# Patient Record
Sex: Male | Born: 1955 | Race: White | Hispanic: No | Marital: Married | Smoking: Never smoker
Health system: Southern US, Community
[De-identification: ages and names within clinical notes are randomized; demographics above are authoritative.]

## PROBLEM LIST (undated history)

## (undated) DIAGNOSIS — E669 Obesity, unspecified: Secondary | ICD-10-CM

## (undated) DIAGNOSIS — I4819 Other persistent atrial fibrillation: Secondary | ICD-10-CM

## (undated) DIAGNOSIS — K219 Gastro-esophageal reflux disease without esophagitis: Secondary | ICD-10-CM

## (undated) DIAGNOSIS — I10 Essential (primary) hypertension: Secondary | ICD-10-CM

## (undated) DIAGNOSIS — E7800 Pure hypercholesterolemia, unspecified: Secondary | ICD-10-CM

## (undated) DIAGNOSIS — I471 Supraventricular tachycardia, unspecified: Secondary | ICD-10-CM

## (undated) DIAGNOSIS — C4492 Squamous cell carcinoma of skin, unspecified: Secondary | ICD-10-CM

## (undated) DIAGNOSIS — F419 Anxiety disorder, unspecified: Secondary | ICD-10-CM

## (undated) DIAGNOSIS — J45909 Unspecified asthma, uncomplicated: Secondary | ICD-10-CM

## (undated) DIAGNOSIS — Z860100 Personal history of colon polyps, unspecified: Secondary | ICD-10-CM

## (undated) DIAGNOSIS — N529 Male erectile dysfunction, unspecified: Secondary | ICD-10-CM

## (undated) HISTORY — DX: Squamous cell carcinoma of skin, unspecified: C44.92

## (undated) HISTORY — DX: Male erectile dysfunction, unspecified: N52.9

## (undated) HISTORY — DX: Supraventricular tachycardia: I47.1

## (undated) HISTORY — DX: Gastro-esophageal reflux disease without esophagitis: K21.9

## (undated) HISTORY — DX: Personal history of colonic polyps: Z86.010

## (undated) HISTORY — DX: Unspecified asthma, uncomplicated: J45.909

## (undated) HISTORY — PX: WISDOM TOOTH EXTRACTION: SHX21

## (undated) HISTORY — PX: OTHER SURGICAL HISTORY: SHX169

## (undated) HISTORY — DX: Anxiety disorder, unspecified: F41.9

## (undated) HISTORY — DX: Obesity, unspecified: E66.9

## (undated) HISTORY — DX: Pure hypercholesterolemia, unspecified: E78.00

## (undated) HISTORY — DX: Essential (primary) hypertension: I10

## (undated) HISTORY — DX: Personal history of colon polyps, unspecified: Z86.0100

## (undated) HISTORY — DX: Supraventricular tachycardia, unspecified: I47.10

---

## 2006-07-13 ENCOUNTER — Emergency Department (HOSPITAL_COMMUNITY): Admission: EM | Admit: 2006-07-13 | Discharge: 2006-07-13 | Payer: Self-pay | Admitting: Emergency Medicine

## 2007-11-05 ENCOUNTER — Ambulatory Visit: Admission: RE | Admit: 2007-11-05 | Discharge: 2008-02-03 | Payer: Self-pay | Admitting: Radiation Oncology

## 2011-09-15 ENCOUNTER — Encounter: Payer: Self-pay | Admitting: Critical Care Medicine

## 2011-10-16 ENCOUNTER — Encounter: Payer: Self-pay | Admitting: Critical Care Medicine

## 2011-10-17 ENCOUNTER — Ambulatory Visit (HOSPITAL_COMMUNITY)
Admission: RE | Admit: 2011-10-17 | Discharge: 2011-10-17 | Disposition: A | Discharge status: Home / Self Care | Payer: BC Managed Care – PPO | Source: Ambulatory Visit | Attending: Critical Care Medicine | Admitting: Critical Care Medicine

## 2011-10-17 ENCOUNTER — Encounter: Payer: Self-pay | Admitting: Critical Care Medicine

## 2011-10-17 ENCOUNTER — Ambulatory Visit (INDEPENDENT_AMBULATORY_CARE_PROVIDER_SITE_OTHER): Payer: BC Managed Care – PPO | Admitting: Critical Care Medicine

## 2011-10-17 VITALS — BP 122/72 | HR 52 | Temp 98.3°F | Ht 73.0 in | Wt 280.4 lb

## 2011-10-17 DIAGNOSIS — J45909 Unspecified asthma, uncomplicated: Secondary | ICD-10-CM | POA: Insufficient documentation

## 2011-10-17 DIAGNOSIS — R0600 Dyspnea, unspecified: Secondary | ICD-10-CM

## 2011-10-17 DIAGNOSIS — F419 Anxiety disorder, unspecified: Secondary | ICD-10-CM | POA: Insufficient documentation

## 2011-10-17 DIAGNOSIS — E669 Obesity, unspecified: Secondary | ICD-10-CM | POA: Insufficient documentation

## 2011-10-17 DIAGNOSIS — R0609 Other forms of dyspnea: Secondary | ICD-10-CM

## 2011-10-17 DIAGNOSIS — R05 Cough: Secondary | ICD-10-CM | POA: Insufficient documentation

## 2011-10-17 DIAGNOSIS — N529 Male erectile dysfunction, unspecified: Secondary | ICD-10-CM | POA: Insufficient documentation

## 2011-10-17 DIAGNOSIS — J988 Other specified respiratory disorders: Secondary | ICD-10-CM | POA: Insufficient documentation

## 2011-10-17 DIAGNOSIS — R0989 Other specified symptoms and signs involving the circulatory and respiratory systems: Secondary | ICD-10-CM

## 2011-10-17 DIAGNOSIS — F411 Generalized anxiety disorder: Secondary | ICD-10-CM

## 2011-10-17 DIAGNOSIS — R059 Cough, unspecified: Secondary | ICD-10-CM

## 2011-10-17 DIAGNOSIS — I4891 Unspecified atrial fibrillation: Secondary | ICD-10-CM

## 2011-10-17 DIAGNOSIS — I480 Paroxysmal atrial fibrillation: Secondary | ICD-10-CM | POA: Insufficient documentation

## 2011-10-17 DIAGNOSIS — I10 Essential (primary) hypertension: Secondary | ICD-10-CM

## 2011-10-17 LAB — PULMONARY FUNCTION TEST

## 2011-10-17 MED ORDER — ALBUTEROL SULFATE (5 MG/ML) 0.5% IN NEBU
2.5000 mg | INHALATION_SOLUTION | Freq: Once | RESPIRATORY_TRACT | Status: AC
  Administered 2011-10-17: 2.5 mg via RESPIRATORY_TRACT

## 2011-10-17 NOTE — Assessment & Plan Note (Signed)
Chronic exertional dyspnea with hyperinflation on chest x-ray in a patient with MZ alpha one  Phenotype We need to determine the patient's current alpha 1 antitrypsin levels, if less than 10 the patient might be considered a candidate for replacement therapy particularly if obstructive lung disease is documented The patient does have a family history of alpha 1 antitrypsin deficiency and his daughter who is a Z Z. Phenotype  Plan Check alpha-1 level Obtain full set of pulmonary function studies Obtain an overnight sleep oximetry Therapy will be dependent upon results of the above studies

## 2011-10-17 NOTE — Progress Notes (Signed)
Subjective:    Patient ID: Calvin Collins, male    DOB: 01-Aug-1956, 56 y.o.   MRN: [id number]  HPI 56 y.o. M referred for abn CXR Pt with decline of lung function since 56yo.  PiMZ phenotype.  Father died of emphysema and daughter is ZZ dx 3yo.  TPL 7yo and 22 and in good health.  Both wife MZ.  One MM and One MZ and ZZ. Father had lung dz, two brothers with heart disease.  Last had phenotyping in 1993.  Never smoker. Occ cigars but none now. Father smoked 74yrs and quit.  No other passive smoke.  Now is dypsneic with activity.  Does run and works out but declining lung capacity.   CXR shows hyperinflation.  Does have a deep wheezing cough.  Coughs every day. Does not have a productive cough.  Spouse is on symbicort and steroids and hx asthma as a child.  Past Medical History  Diagnosis Date  . Paroxysmal atrial fibrillation     OCCASIONAL SVT  . Hypertension   . Obesity   . Fatigue   . SOB (shortness of breath)   . ED (erectile dysfunction)   . Anxiety      Family History  Problem Relation Age of Onset  . Emphysema Father   . Emphysema Paternal Uncle   . Other Daughter     liver transplant  . Hypertension Mother   . Hypertension Father   . Stroke Mother   . Cancer Brother   . Skin cancer Brother     basal cell  . Skin cancer Sister     basal cell     History   Social History  . Marital Status: Married    Spouse Name: N/A    Number of Children: 4  . Years of Education: N/A   Occupational History  . College Professor    Social History Main Topics  . Smoking status: Never Smoker   . Smokeless tobacco: Never Used  . Alcohol Use: Yes     4-5 times weekly  . Drug Use: No  . Sexually Active: Not on file   Other Topics Concern  . Not on file   Social History Narrative  . No narrative on file     No Known Allergies   Outpatient Prescriptions Prior to Visit  Medication Sig Dispense Refill  . nebivolol (BYSTOLIC) 10 MG tablet Take 10 mg by mouth daily.          No facility-administered medications prior to visit.      Review of Systems  Constitutional: Positive for chills, fatigue and unexpected weight change. Negative for fever, diaphoresis, activity change and appetite change.  HENT: Positive for congestion, trouble swallowing, neck pain and neck stiffness. Negative for hearing loss, ear pain, nosebleeds, sore throat, facial swelling, rhinorrhea, sneezing, mouth sores, dental problem, voice change, postnasal drip, sinus pressure, tinnitus and ear discharge.   Eyes: Negative for photophobia, discharge, itching and visual disturbance.  Respiratory: Positive for cough, chest tightness, shortness of breath and wheezing. Negative for apnea, choking and stridor.   Cardiovascular: Positive for palpitations and leg swelling. Negative for chest pain.  Gastrointestinal: Negative for nausea, vomiting, abdominal pain, constipation, blood in stool and abdominal distention.  Genitourinary: Positive for frequency. Negative for dysuria, urgency, hematuria, flank pain, decreased urine volume and difficulty urinating.  Musculoskeletal: Positive for arthralgias. Negative for myalgias, back pain, joint swelling and gait problem.  Skin: Negative for color change, pallor and rash.  Neurological: Positive for  tremors and weakness. Negative for dizziness, seizures, syncope, speech difficulty, light-headedness, numbness and headaches.  Hematological: Negative for adenopathy. Does not bruise/bleed easily.  Psychiatric/Behavioral: Positive for sleep disturbance. Negative for confusion and agitation. The patient is not nervous/anxious.        Objective:   Physical Exam  Filed Vitals:   10/17/11 0900  BP: 122/72  Pulse: 52  Temp: 98.3 F (36.8 C)  TempSrc: Oral  Height: 6\' 1"  (1.854 m)  Weight: 280 lb 6.4 oz (127.189 kg)  SpO2: 99%    Gen: Pleasant, well-nourished, in no distress,  normal affect  ENT: No lesions,  mouth clear,  oropharynx clear, no  postnasal drip  Neck: No JVD, no TMG, no carotid bruits  Lungs: No use of accessory muscles, no dullness to percussion, distant BS  Cardiovascular: RRR, heart sounds normal, no murmur or gallops, no peripheral edema  Abdomen: soft and NT, no HSM,  BS normal  Musculoskeletal: No deformities, no cyanosis or clubbing  Neuro: alert, non focal  Skin: Warm, no lesions or rashes  CXR:  08/28/11:  Hyperinflation, no active disease        Assessment & Plan:   Dyspnea Chronic exertional dyspnea with hyperinflation on chest x-ray in a patient with MZ alpha one  Phenotype We need to determine the patient's current alpha 1 antitrypsin levels, if less than 10 the patient might be considered a candidate for replacement therapy particularly if obstructive lung disease is documented The patient does have a family history of alpha 1 antitrypsin deficiency and his daughter who is a Z Z. Phenotype  Plan Check alpha-1 level Obtain full set of pulmonary function studies Obtain an overnight sleep oximetry Therapy will be dependent upon results of the above studies    Updated Medication List Outpatient Encounter Prescriptions as of 10/17/2011  Medication Sig Dispense Refill  . guaiFENesin (MUCINEX) 600 MG 12 hr tablet Take 600 mg by mouth as needed.      . nebivolol (BYSTOLIC) 10 MG tablet Take 10 mg by mouth daily.        . sertraline (ZOLOFT) 25 MG tablet Take 25 mg by mouth daily.       No facility-administered encounter medications on file as of 10/17/2011.

## 2011-10-17 NOTE — Patient Instructions (Signed)
An alpha one antitrypsin assay will be obtained>>this takes two weeks to return A full pulmonary function test will be obtained, based upon this , will likely prescribed some type of inhaler to use  An overnight sleep oxygen test will be obtained Focus on weight loss. See weight loss instruction sheet. Return 2 months, I will call sooner with test results

## 2011-10-29 ENCOUNTER — Telehealth: Payer: Self-pay | Admitting: Critical Care Medicine

## 2011-10-29 DIAGNOSIS — R0600 Dyspnea, unspecified: Secondary | ICD-10-CM

## 2011-10-29 NOTE — Telephone Encounter (Signed)
Pt PFTs reviewed. Mod peripheral airflow obstruction seen Pt called, left msg, he will need OV soon to discuss results.  Need to get alpha one test and ONO on RA back before OV made

## 2011-10-29 NOTE — Telephone Encounter (Signed)
Pt returned call.  He did receive PW's message regarding PFT results and was wondering he if still needs to have the ONO done which is scheduled for tonight.  I advised pt he will need OV soon to discuss results, but before this appt is scheduled, PW would like to get the results back from the alpha one and ONO.  He verbalized understanding of this and will proceed with ONO tonight.  I advised pt I will call him back once we receive the alpha one test results to schedule OV with PW but if anything further is needed prior to this to please call back.  He verbalized understanding of this.  Will hold in my box to f/u on.  Note: Alpha one test done on 10/17/11

## 2011-10-30 ENCOUNTER — Encounter: Payer: Self-pay | Admitting: Critical Care Medicine

## 2011-11-03 NOTE — Telephone Encounter (Signed)
Called [phone], spoke with Westly Pam to check on the status of alpha 1 test who states results are not quite ready yet but should be mailed out in the next day or two. Will hold in my box to follow up on.

## 2011-11-04 ENCOUNTER — Encounter: Payer: Self-pay | Admitting: Critical Care Medicine

## 2011-11-04 NOTE — Telephone Encounter (Signed)
I called pt's home and cell #'s - [id number]

## 2011-11-04 NOTE — Telephone Encounter (Signed)
Call pt and tell him ONO was normal.  No need for any oxygen. Still waiting on Alpha one test.

## 2011-11-06 NOTE — Telephone Encounter (Signed)
Received Alpha 1 Test results - MZ.  Per Dr. Delford Field, tell pt he is a carrier but does not need replacement therapy at this time.  Still have him come in for OV to discuss everything in detail.    Called pt's home and cell #'s - [email or web address] to inform him of ONO results, alpha 1 results, and schedule f/u with PW.

## 2011-11-07 ENCOUNTER — Encounter: Payer: Self-pay | Admitting: Critical Care Medicine

## 2011-11-10 ENCOUNTER — Telehealth: Payer: Self-pay | Admitting: Critical Care Medicine

## 2011-11-10 NOTE — Telephone Encounter (Signed)
Spoke with pt.  I informed him of Alpha 1 and ONO results and recs per Dr. Delford Field and advised Dr. Delford Field would still like him to come in for OV to discuss everything in detail.  He verbalized understanding of this and has a pending OV with Dr. Delford Field on December 22, 2011 at 9 am in [place].  Pt is requesting to keep this appt for now - He did not was to schedule an OV sooner at this time.  Advised if anything is needed prior to this or he decides he would like to come in sooner, to please call back.  He verbalized understanding of this, it ok with this plan,  and voiced no further questions/cocnerns at this time.

## 2011-11-10 NOTE — Telephone Encounter (Signed)
Called pt's home and cell #s - [id number] 

## 2011-11-10 NOTE — Telephone Encounter (Signed)
Spoke with pt.  Pls see phone note from 10/29/2011 for additional information.

## 2011-11-20 ENCOUNTER — Encounter: Payer: Self-pay | Admitting: Critical Care Medicine

## 2011-12-22 ENCOUNTER — Ambulatory Visit (INDEPENDENT_AMBULATORY_CARE_PROVIDER_SITE_OTHER): Payer: BC Managed Care – PPO | Admitting: Critical Care Medicine

## 2011-12-22 ENCOUNTER — Encounter: Payer: Self-pay | Admitting: Critical Care Medicine

## 2011-12-22 VITALS — BP 140/86 | HR 55 | Temp 98.2°F | Ht 72.0 in | Wt 275.5 lb

## 2011-12-22 DIAGNOSIS — J45909 Unspecified asthma, uncomplicated: Secondary | ICD-10-CM

## 2011-12-22 MED ORDER — MOMETASONE FUROATE 220 MCG/INH IN AEPB: 2.0000 | INHALATION_SPRAY | Freq: Every day | RESPIRATORY_TRACT | Status: DC

## 2011-12-22 NOTE — Progress Notes (Signed)
Subjective:    Patient ID: Calvin Collins, male    DOB: 1955-10-06, 56 y.o.   MRN: [id number]  HPI  57 y.o. M referred for abn CXR Pt with decline of lung function since 56yo.  PiMZ phenotype.  Father died of emphysema and daughter is ZZ dx 3yo.  TPL 9yo and 22 and in good health.  Both wife MZ.  One MM and One MZ and ZZ. Father had lung dz, two brothers with heart disease.  Last had phenotyping in 1993.  Never smoker. Occ cigars but none now. Father smoked 39yrs and quit.  No other passive smoke.  Now is dypsneic with activity.  Does run and works out but declining lung capacity.   CXR shows hyperinflation.  Does have a deep wheezing cough.  Coughs every day. Does not have a productive cough.  Spouse is on symbicort and steroids and hx asthma as a child.  12/22/2011 MZ on phenotype level 18. Symptom now is a cough,  Cough more common.  CXR hyperinflation.  A1AT: 18  MZ phenotype. Able to run and do well.  Cough is deep and wheezing like. Cough more in evening than AM. Pt denies any significant sore throat, nasal congestion or excess secretions, fever, chills, sweats, unintended weight loss, pleurtic or exertional chest pain, orthopnea PND, or leg swelling Pt denies any increase in rescue therapy over baseline, denies waking up needing it or having any early am or nocturnal exacerbations of coughing/wheezing/or dyspnea. Pt also denies any obvious fluctuation in symptoms with  weather or environmental change or other alleviating or aggravating factors    Past Medical History  Diagnosis Date  . Paroxysmal atrial fibrillation     OCCASIONAL SVT  . Hypertension   . Obesity   . Fatigue   . SOB (shortness of breath)   . ED (erectile dysfunction)   . Anxiety      Family History  Problem Relation Age of Onset  . Emphysema Father   . Emphysema Paternal Uncle   . Other Daughter     liver transplant  . Hypertension Mother   . Hypertension Father   . Stroke Mother   . Cancer Brother     . Skin cancer Brother     basal cell  . Skin cancer Sister     basal cell     History   Social History  . Marital Status: Married    Spouse Name: N/A    Number of Children: 4  . Years of Education: N/A   Occupational History  . College Professor    Social History Main Topics  . Smoking status: Never Smoker   . Smokeless tobacco: Never Used  . Alcohol Use: Yes     4-5 times weekly  . Drug Use: No  . Sexually Active: Not on file   Other Topics Concern  . Not on file   Social History Narrative  . No narrative on file     No Known Allergies   Outpatient Prescriptions Prior to Visit  Medication Sig Dispense Refill  . guaiFENesin (MUCINEX) 600 MG 12 hr tablet Take 600 mg by mouth as needed.      . nebivolol (BYSTOLIC) 10 MG tablet Take 10 mg by mouth daily.        . sertraline (ZOLOFT) 25 MG tablet Take 25 mg by mouth daily.          Review of Systems  Constitutional: Positive for unexpected weight change. Negative for fever, chills, diaphoresis,  activity change, appetite change and fatigue.  HENT: Positive for congestion, trouble swallowing, neck pain and neck stiffness. Negative for hearing loss, ear pain, nosebleeds, sore throat, facial swelling, rhinorrhea, sneezing, mouth sores, dental problem, voice change, postnasal drip, sinus pressure, tinnitus and ear discharge.   Eyes: Negative for photophobia, discharge, itching and visual disturbance.  Respiratory: Positive for cough, chest tightness, shortness of breath and wheezing. Negative for apnea, choking and stridor.   Cardiovascular: Negative for chest pain, palpitations and leg swelling.  Gastrointestinal: Negative for nausea, vomiting, abdominal pain, constipation, blood in stool and abdominal distention.  Genitourinary: Positive for frequency. Negative for dysuria, urgency, hematuria, flank pain, decreased urine volume and difficulty urinating.  Musculoskeletal: Positive for arthralgias. Negative for myalgias,  back pain, joint swelling and gait problem.  Skin: Negative for color change, pallor and rash.  Neurological: Negative for dizziness, tremors, seizures, syncope, speech difficulty, weakness, light-headedness, numbness and headaches.  Hematological: Negative for adenopathy. Does not bruise/bleed easily.  Psychiatric/Behavioral: Positive for sleep disturbance. Negative for confusion and agitation. The patient is not nervous/anxious.        Objective:   Physical Exam   Filed Vitals:   12/22/11 0902  BP: 140/86  Pulse: 55  Temp: 98.2 F (36.8 C)  TempSrc: Oral  Height: 6' (1.829 m)  Weight: 275 lb 8 oz (124.966 kg)  SpO2: 98%    Gen: Pleasant, well-nourished, in no distress,  normal affect  ENT: No lesions,  mouth clear,  oropharynx clear, no postnasal drip  Neck: No JVD, no TMG, no carotid bruits  Lungs: No use of accessory muscles, no dullness to percussion, distant BS  Cardiovascular: RRR, heart sounds normal, no murmur or gallops, no peripheral edema  Abdomen: soft and NT, no HSM,  BS normal  Musculoskeletal: No deformities, no cyanosis or clubbing  Neuro: alert, non focal  Skin: Warm, no lesions or rashes  CXR:  08/28/11:  Hyperinflation, no active disease        Assessment & Plan:   Asthma with allergic rhinitis Asthma without Copd or emphysema A1AT levels normal despite MZ phenotype No desat at night Plan Asmanex two puff daily     Updated Medication List Outpatient Encounter Prescriptions as of 12/22/2011  Medication Sig Dispense Refill  . guaiFENesin (MUCINEX) 600 MG 12 hr tablet Take 600 mg by mouth as needed.      Marland Kitchen LORazepam (ATIVAN) 1 MG tablet as needed. Seldom takes this      . nebivolol (BYSTOLIC) 10 MG tablet Take 10 mg by mouth daily.        . sertraline (ZOLOFT) 25 MG tablet Take 25 mg by mouth daily.      . mometasone (ASMANEX 120 METERED DOSES) 220 MCG/INH inhaler Inhale 2 puffs into the lungs daily.  1 Inhaler  12

## 2011-12-22 NOTE — Patient Instructions (Signed)
Start asmanex two puff daily Return 4 months

## 2011-12-22 NOTE — Assessment & Plan Note (Signed)
Asthma without Copd or emphysema A1AT levels normal despite MZ phenotype No desat at night Plan Asmanex two puff daily

## 2013-08-24 ENCOUNTER — Other Ambulatory Visit: Payer: Self-pay | Admitting: Family Medicine

## 2013-08-24 DIAGNOSIS — R1011 Right upper quadrant pain: Secondary | ICD-10-CM

## 2013-08-29 ENCOUNTER — Ambulatory Visit
Admission: RE | Admit: 2013-08-29 | Discharge: 2013-08-29 | Disposition: A | Discharge status: Home / Self Care | Payer: BC Managed Care – PPO | Source: Ambulatory Visit | Attending: Family Medicine | Admitting: Family Medicine

## 2013-08-29 ENCOUNTER — Other Ambulatory Visit: Payer: Self-pay | Admitting: Family Medicine

## 2013-08-29 DIAGNOSIS — R1011 Right upper quadrant pain: Secondary | ICD-10-CM

## 2013-08-30 ENCOUNTER — Other Ambulatory Visit: Payer: Self-pay | Admitting: Family Medicine

## 2013-08-30 DIAGNOSIS — R109 Unspecified abdominal pain: Secondary | ICD-10-CM

## 2013-09-06 ENCOUNTER — Ambulatory Visit
Admission: RE | Admit: 2013-09-06 | Discharge: 2013-09-06 | Disposition: A | Discharge status: Home / Self Care | Payer: BC Managed Care – PPO | Source: Ambulatory Visit | Attending: Family Medicine | Admitting: Family Medicine

## 2013-09-06 DIAGNOSIS — R109 Unspecified abdominal pain: Secondary | ICD-10-CM

## 2013-09-06 MED ORDER — IOHEXOL 300 MG/ML  SOLN
125.0000 mL | Freq: Once | INTRAMUSCULAR | Status: AC | PRN
  Administered 2013-09-06: 125 mL via INTRAVENOUS

## 2014-10-25 ENCOUNTER — Other Ambulatory Visit: Payer: Self-pay | Admitting: Family Medicine

## 2014-10-25 ENCOUNTER — Encounter (INDEPENDENT_AMBULATORY_CARE_PROVIDER_SITE_OTHER): Payer: Self-pay

## 2014-10-25 ENCOUNTER — Ambulatory Visit
Admission: RE | Admit: 2014-10-25 | Discharge: 2014-10-25 | Disposition: A | Discharge status: Home / Self Care | Payer: BC Managed Care – PPO | Source: Ambulatory Visit | Attending: Family Medicine | Admitting: Family Medicine

## 2014-10-25 DIAGNOSIS — J45909 Unspecified asthma, uncomplicated: Secondary | ICD-10-CM

## 2017-02-20 ENCOUNTER — Other Ambulatory Visit: Payer: Self-pay | Admitting: Family Medicine

## 2017-02-20 DIAGNOSIS — R1032 Left lower quadrant pain: Secondary | ICD-10-CM

## 2017-02-24 ENCOUNTER — Ambulatory Visit
Admission: RE | Admit: 2017-02-24 | Discharge: 2017-02-24 | Disposition: A | Discharge status: Home / Self Care | Payer: BC Managed Care – PPO | Source: Ambulatory Visit | Attending: Family Medicine | Admitting: Family Medicine

## 2017-02-24 DIAGNOSIS — R1032 Left lower quadrant pain: Secondary | ICD-10-CM

## 2017-02-24 MED ORDER — IOPAMIDOL (ISOVUE-300) INJECTION 61%
125.0000 mL | Freq: Once | INTRAVENOUS | Status: AC | PRN
  Administered 2017-02-24: 125 mL via INTRAVENOUS

## 2017-03-02 ENCOUNTER — Other Ambulatory Visit: Payer: Self-pay | Admitting: Family Medicine

## 2017-03-02 DIAGNOSIS — M5432 Sciatica, left side: Secondary | ICD-10-CM

## 2017-03-05 ENCOUNTER — Other Ambulatory Visit: Payer: BC Managed Care – PPO

## 2017-03-05 ENCOUNTER — Ambulatory Visit
Admission: RE | Admit: 2017-03-05 | Discharge: 2017-03-05 | Disposition: A | Discharge status: Home / Self Care | Payer: BC Managed Care – PPO | Source: Ambulatory Visit | Attending: Family Medicine | Admitting: Family Medicine

## 2017-03-05 DIAGNOSIS — M5432 Sciatica, left side: Secondary | ICD-10-CM

## 2018-01-13 ENCOUNTER — Other Ambulatory Visit: Payer: Self-pay | Admitting: Family Medicine

## 2018-01-13 ENCOUNTER — Ambulatory Visit
Admission: RE | Admit: 2018-01-13 | Discharge: 2018-01-13 | Disposition: A | Discharge status: Home / Self Care | Payer: BC Managed Care – PPO | Source: Ambulatory Visit | Attending: Family Medicine | Admitting: Family Medicine

## 2018-01-13 DIAGNOSIS — R52 Pain, unspecified: Secondary | ICD-10-CM

## 2018-01-22 ENCOUNTER — Other Ambulatory Visit (HOSPITAL_COMMUNITY): Payer: Self-pay | Admitting: Physician Assistant

## 2018-01-22 DIAGNOSIS — R634 Abnormal weight loss: Secondary | ICD-10-CM

## 2018-01-22 DIAGNOSIS — R1011 Right upper quadrant pain: Secondary | ICD-10-CM

## 2018-01-25 ENCOUNTER — Encounter (INDEPENDENT_AMBULATORY_CARE_PROVIDER_SITE_OTHER): Payer: Self-pay

## 2018-01-25 ENCOUNTER — Ambulatory Visit (HOSPITAL_COMMUNITY)
Admission: RE | Admit: 2018-01-25 | Discharge: 2018-01-25 | Disposition: A | Discharge status: Home / Self Care | Payer: BC Managed Care – PPO | Source: Ambulatory Visit | Attending: Physician Assistant | Admitting: Physician Assistant

## 2018-01-25 DIAGNOSIS — R634 Abnormal weight loss: Secondary | ICD-10-CM

## 2018-01-25 DIAGNOSIS — R1011 Right upper quadrant pain: Secondary | ICD-10-CM | POA: Insufficient documentation

## 2018-02-02 DIAGNOSIS — I4819 Other persistent atrial fibrillation: Secondary | ICD-10-CM

## 2018-02-02 HISTORY — DX: Other persistent atrial fibrillation: I48.19

## 2018-02-04 ENCOUNTER — Other Ambulatory Visit: Payer: Self-pay | Admitting: Physician Assistant

## 2018-02-04 DIAGNOSIS — R1011 Right upper quadrant pain: Secondary | ICD-10-CM

## 2018-02-04 DIAGNOSIS — R634 Abnormal weight loss: Secondary | ICD-10-CM

## 2018-02-11 ENCOUNTER — Ambulatory Visit (HOSPITAL_COMMUNITY)
Admission: RE | Admit: 2018-02-11 | Discharge: 2018-02-11 | Disposition: A | Discharge status: Home / Self Care | Payer: BC Managed Care – PPO | Source: Ambulatory Visit | Attending: Nurse Practitioner | Admitting: Nurse Practitioner

## 2018-02-11 ENCOUNTER — Encounter (HOSPITAL_COMMUNITY): Payer: Self-pay | Admitting: Nurse Practitioner

## 2018-02-11 VITALS — BP 148/84 | HR 94 | Ht 72.0 in | Wt 251.0 lb

## 2018-02-11 DIAGNOSIS — I4891 Unspecified atrial fibrillation: Secondary | ICD-10-CM | POA: Diagnosis present

## 2018-02-11 DIAGNOSIS — E669 Obesity, unspecified: Secondary | ICD-10-CM | POA: Diagnosis not present

## 2018-02-11 DIAGNOSIS — I10 Essential (primary) hypertension: Secondary | ICD-10-CM | POA: Diagnosis not present

## 2018-02-11 DIAGNOSIS — Z7901 Long term (current) use of anticoagulants: Secondary | ICD-10-CM | POA: Diagnosis not present

## 2018-02-11 DIAGNOSIS — F419 Anxiety disorder, unspecified: Secondary | ICD-10-CM | POA: Insufficient documentation

## 2018-02-11 DIAGNOSIS — Z79899 Other long term (current) drug therapy: Secondary | ICD-10-CM | POA: Diagnosis not present

## 2018-02-11 DIAGNOSIS — Z8249 Family history of ischemic heart disease and other diseases of the circulatory system: Secondary | ICD-10-CM | POA: Insufficient documentation

## 2018-02-11 DIAGNOSIS — Z823 Family history of stroke: Secondary | ICD-10-CM | POA: Diagnosis not present

## 2018-02-11 NOTE — Progress Notes (Signed)
Primary Care Physician: Harlan Stains, MD Referring Physician: Self referred   Calvin Collins is a 62 y.o. male with a h/o a h/o SVT, anxiety, HTN, that is in the afib clinic for recently diagnosed afib. He and his wife were getting ready to leave on vacation when he noted increased irregular heart beat. He was able to get into [facility] and EKG confirmed afib. He was then able be seen by Roque Cash, [place], [facility]. He was started on xarelto 20 mg daily. Echo obtained and showed Mod LVH with mod dilated left atrium. He is in afib clinic for further f/u.   He admits that he has h/o anxiety/Panic attacks around 20 years ago and came off meds 10 years ago. He has had ongoing abdominal pain and is pending imaging of his Liver next week. The unknown etiology of his pain  has caused him a lot of anxiety recetnly.  He drinks minimal caffeine, no tobacco, no sleep apnea.  One to two alcoholic drinks per evening.Works as a Professor at [employer]. He is physically active and has a HR in the upper 40's and low 50's in [place].  Bystolic dose  was decreased couple months ago for brady from 5 mg qd to 2.5 mg qd. He seems to be tolerating afib well currently.   Today, he denies symptoms of chest pain, shortness of breath, orthopnea, PND, lower extremity edema, dizziness, presyncope, syncope, or neurologic sequela. The patient is tolerating medications without difficulties and is otherwise without complaint today.   Past Medical History:  Diagnosis Date  . Anxiety   . ED (erectile dysfunction)   . Fatigue   . Hypertension   . Obesity   . Paroxysmal atrial fibrillation (HCC)    OCCASIONAL SVT  . SOB (shortness of breath)      Current Outpatient Medications  Medication Sig Dispense Refill  . albuterol (PROVENTIL HFA;VENTOLIN HFA) 108 (90 Base) MCG/ACT inhaler Inhale into the lungs every 6 (six) hours as needed for wheezing or shortness of breath.    . budesonide (PULMICORT) 180 MCG/ACT inhaler  Inhale into the lungs 2 (two) times daily.    Marland Kitchen LORazepam (ATIVAN) 1 MG tablet as needed. Seldom takes this    . nebivolol (BYSTOLIC) 2.5 MG tablet Take 2.5 mg by mouth daily.    . rivaroxaban (XARELTO) 20 MG TABS tablet Take 20 mg by mouth daily with supper.    . sildenafil (VIAGRA) 50 MG tablet Take 50 mg by mouth daily as needed for erectile dysfunction.     No current facility-administered medications for this encounter.     No Known Allergies  Social History   Socioeconomic History  . Marital status: Married    Spouse name: Not on file  . Number of children: 4  . Years of education: Not on file  . Highest education level: Not on file  Occupational History  . Occupation: Secretary/administrator Professor  Social Needs  . Financial resource strain: Not on file  . Food insecurity:    Worry: Not on file    Inability: Not on file  . Transportation needs:    Medical: Not on file    Non-medical: Not on file  Tobacco Use  . Smoking status: Never Smoker  . Smokeless tobacco: Never Used  Substance and Sexual Activity  . Alcohol use: Yes    Comment: 4-5 times weekly  . Drug use: No  . Sexual activity: Not on file  Lifestyle  . Physical activity:  Days per week: Not on file    Minutes per session: Not on file  . Stress: Not on file  Relationships  . Social connections:    Talks on phone: Not on file    Gets together: Not on file    Attends religious service: Not on file    Active member of club or organization: Not on file    Attends meetings of clubs or organizations: Not on file    Relationship status: Not on file  . Intimate partner violence:    Fear of current or ex partner: Not on file    Emotionally abused: Not on file    Physically abused: Not on file    Forced sexual activity: Not on file  Other Topics Concern  . Not on file  Social History Narrative  . Not on file    Family History  Problem Relation Age of Onset  . Emphysema Father   . Emphysema Paternal Uncle   .  Other Daughter        liver transplant  . Hypertension Mother   . Hypertension Father   . Stroke Mother   . Cancer Brother   . Skin cancer Brother        basal cell  . Skin cancer Sister        basal cell    ROS- All systems are reviewed and negative except as per the HPI above  Physical Exam: Vitals:   02/11/18 1021  BP: (!) 148/84  Pulse: 94  Weight: 251 lb (113.9 kg)  Height: 6' (1.829 m)   Wt Readings from Last 3 Encounters:  02/11/18 251 lb (113.9 kg)  12/22/11 275 lb 8 oz (125 kg)  10/17/11 280 lb 6.4 oz (127.2 kg)    Labs: No results found for: NA, K, CL, CO2, GLUCOSE, BUN, CREATININE, CALCIUM, PHOS, MG No results found for: INR No results found for: CHOL, HDL, LDLCALC, TRIG   GEN- The patient is well appearing, alert and oriented x 3 today.   Head- normocephalic, atraumatic Eyes-  Sclera clear, conjunctiva pink Ears- hearing intact Oropharynx- clear Neck- supple, no JVP Lymph- no cervical lymphadenopathy Lungs- Clear to ausculation bilaterally, normal work of breathing Heart- irregular rate and rhythm, no murmurs, rubs or gallops, PMI not laterally displaced GI- soft, NT, ND, + BS Extremities- no clubbing, cyanosis, or edema MS- no significant deformity or atrophy Skin- no rash or lesion Psych- euthymic mood, full affect Neuro- strength and sensation are intact  EKG-afib at 94 bpm, qrs itn 82 ms, qtc 415 ms Records form [facility] and cardiology reviewed Echo to be scanned into system.    Assessment and Plan: 1. New onset afib General education re afib  Reassured  Triggers discussed Decrease alcohol to no more than 2 drinks a week Reasonably rate controlled on Bystolic Continue xarelto 20 mg daily for chadvasc score of 1( Mod LV dysfunction)started 5/ 29 Bleeding precautions discussed Will plan of cardioversion after 3 weeks of uninterrupted anticoagulation  F/u in clinic 6/20  Butch Penny C. Carroll, [facility] [address] [place], [place] [address] [phone]

## 2018-02-15 ENCOUNTER — Ambulatory Visit
Admission: RE | Admit: 2018-02-15 | Discharge: 2018-02-15 | Disposition: A | Discharge status: Home / Self Care | Payer: BC Managed Care – PPO | Source: Ambulatory Visit | Attending: Physician Assistant | Admitting: Physician Assistant

## 2018-02-15 DIAGNOSIS — R634 Abnormal weight loss: Secondary | ICD-10-CM

## 2018-02-15 DIAGNOSIS — R1011 Right upper quadrant pain: Secondary | ICD-10-CM

## 2018-02-15 MED ORDER — IOPAMIDOL (ISOVUE-300) INJECTION 61%
125.0000 mL | Freq: Once | INTRAVENOUS | Status: AC | PRN
  Administered 2018-02-15: 125 mL via INTRAVENOUS

## 2018-02-17 ENCOUNTER — Ambulatory Visit (HOSPITAL_COMMUNITY): Payer: BC Managed Care – PPO | Admitting: Nurse Practitioner

## 2018-02-25 ENCOUNTER — Ambulatory Visit (HOSPITAL_COMMUNITY)
Admission: RE | Admit: 2018-02-25 | Discharge: 2018-02-25 | Disposition: A | Discharge status: Home / Self Care | Payer: BC Managed Care – PPO | Source: Ambulatory Visit | Attending: Nurse Practitioner | Admitting: Nurse Practitioner

## 2018-02-25 ENCOUNTER — Encounter (HOSPITAL_COMMUNITY): Payer: Self-pay | Admitting: Nurse Practitioner

## 2018-02-25 VITALS — BP 130/72 | HR 90 | Ht 72.0 in | Wt 248.0 lb

## 2018-02-25 DIAGNOSIS — Z85828 Personal history of other malignant neoplasm of skin: Secondary | ICD-10-CM | POA: Diagnosis not present

## 2018-02-25 DIAGNOSIS — Z8249 Family history of ischemic heart disease and other diseases of the circulatory system: Secondary | ICD-10-CM | POA: Diagnosis not present

## 2018-02-25 DIAGNOSIS — Z79899 Other long term (current) drug therapy: Secondary | ICD-10-CM | POA: Insufficient documentation

## 2018-02-25 DIAGNOSIS — N529 Male erectile dysfunction, unspecified: Secondary | ICD-10-CM | POA: Diagnosis not present

## 2018-02-25 DIAGNOSIS — I10 Essential (primary) hypertension: Secondary | ICD-10-CM | POA: Insufficient documentation

## 2018-02-25 DIAGNOSIS — I481 Persistent atrial fibrillation: Secondary | ICD-10-CM | POA: Diagnosis present

## 2018-02-25 DIAGNOSIS — E669 Obesity, unspecified: Secondary | ICD-10-CM | POA: Insufficient documentation

## 2018-02-25 DIAGNOSIS — F419 Anxiety disorder, unspecified: Secondary | ICD-10-CM | POA: Diagnosis not present

## 2018-02-25 DIAGNOSIS — Z7901 Long term (current) use of anticoagulants: Secondary | ICD-10-CM | POA: Diagnosis not present

## 2018-02-25 DIAGNOSIS — I4819 Other persistent atrial fibrillation: Secondary | ICD-10-CM

## 2018-02-25 HISTORY — DX: Other persistent atrial fibrillation: I48.19

## 2018-02-25 LAB — CBC
HCT: 46 % (ref 39.0–52.0)
Hemoglobin: 15.9 g/dL (ref 13.0–17.0)
MCH: 32.7 pg (ref 26.0–34.0)
MCHC: 34.6 g/dL (ref 30.0–36.0)
MCV: 94.7 fL (ref 78.0–100.0)
PLATELETS: 228 10*3/uL (ref 150–400)
RBC: 4.86 MIL/uL (ref 4.22–5.81)
RDW: 11.8 % (ref 11.5–15.5)
WBC: 5.6 10*3/uL (ref 4.0–10.5)

## 2018-02-25 LAB — BASIC METABOLIC PANEL
Anion gap: 6 (ref 5–15)
BUN: 9 mg/dL (ref 6–20)
CALCIUM: 9.6 mg/dL (ref 8.9–10.3)
CHLORIDE: 111 mmol/L (ref 101–111)
CO2: 23 mmol/L (ref 22–32)
Creatinine, Ser: 1.02 mg/dL (ref 0.61–1.24)
Glucose, Bld: 104 mg/dL — ABNORMAL HIGH (ref 65–99)
Potassium: 4.4 mmol/L (ref 3.5–5.1)
SODIUM: 140 mmol/L (ref 135–145)

## 2018-02-25 MED ORDER — RIVAROXABAN 20 MG PO TABS: 20.0000 mg | ORAL_TABLET | Freq: Every day | ORAL | 3 refills | Status: DC

## 2018-02-25 NOTE — H&P (View-Only) (Signed)
Primary Care Physician: Harlan Stains, MD Primary Electrophysiologist: none  Calvin Collins is a 61 y.o. male with a history of persistent atrial fibrillation who presents for follow up in the [facility].  Since last being seen in clinic, the patient reports doing reasonably well.  He remains in afib.  + palpitaitons.  Compliant with xarelto.  Today, he  denies symptoms of palpitations, chest pain, shortness of breath, orthopnea, PND, lower extremity edema, dizziness, presyncope, syncope, snoring, daytime somnolence, bleeding, or neurologic sequela. The patient is tolerating medications without difficulties and is otherwise without complaint today.    Atrial Fibrillation Risk Factors:  he does not have symptoms or diagnosis of sleep apnea.  he does not have a history of rheumatic fever.  he does not have a history of alcohol use.  he has a BMI of Body mass index is 33.63 kg/m.Marland Kitchen Filed Weights   02/25/18 1006  Weight: 248 lb (112.5 kg)    LA size: 47 mm   Atrial Fibrillation Management history:  Previous antiarrhythmic drugs: none  Previous cardioversions: none  Previous ablations: none  CHADS2VASC score: 1  Anticoagulation history: on xarelto since 02/03/18   Past Medical History:  Diagnosis Date  . Anxiety   . ED (erectile dysfunction)   . Hypertension   . Obesity   . Persistent atrial fibrillation ([place]) 02/02/2018   Past Surgical History:  Procedure Laterality Date  . left arm surgery    . skin cancer removed      Current Outpatient Medications  Medication Sig Dispense Refill  . albuterol (PROVENTIL HFA;VENTOLIN HFA) 108 (90 Base) MCG/ACT inhaler Inhale into the lungs every 6 (six) hours as needed for wheezing or shortness of breath.    . budesonide (PULMICORT) 180 MCG/ACT inhaler Inhale into the lungs 2 (two) times daily.    Marland Kitchen LORazepam (ATIVAN) 1 MG tablet as needed. Seldom takes this    . nebivolol (BYSTOLIC) 2.5 MG tablet  Take 2.5 mg by mouth daily.    . rivaroxaban (XARELTO) 20 MG TABS tablet Take 1 tablet (20 mg total) by mouth daily with supper. 30 tablet 3  . sildenafil (VIAGRA) 50 MG tablet Take 50 mg by mouth daily as needed for erectile dysfunction.     No current facility-administered medications for this encounter.     No Known Allergies  Social History   Socioeconomic History  . Marital status: Married    Spouse name: Not on file  . Number of children: 4  . Years of education: Not on file  . Highest education level: Not on file  Occupational History  . Occupation: Secretary/administrator Professor  Social Needs  . Financial resource strain: Not on file  . Food insecurity:    Worry: Not on file    Inability: Not on file  . Transportation needs:    Medical: Not on file    Non-medical: Not on file  Tobacco Use  . Smoking status: Never Smoker  . Smokeless tobacco: Never Used  Substance and Sexual Activity  . Alcohol use: Yes    Comment: 4-5 times weekly  . Drug use: No  . Sexual activity: Not on file  Lifestyle  . Physical activity:    Days per week: Not on file    Minutes per session: Not on file  . Stress: Not on file  Relationships  . Social connections:    Talks on phone: Not on file    Gets together: Not on file  Attends religious service: Not on file    Active member of club or organization: Not on file    Attends meetings of clubs or organizations: Not on file    Relationship status: Not on file  . Intimate partner violence:    Fear of current or ex partner: Not on file    Emotionally abused: Not on file    Physically abused: Not on file    Forced sexual activity: Not on file  Other Topics Concern  . Not on file  Social History Narrative   Lives in [place].  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  Attends [employer]    Family History  Problem Relation Age of Onset  . Emphysema Father   . Emphysema Paternal Uncle   . Other Daughter        liver  transplant  . Hypertension Mother   . Hypertension Father   . Stroke Mother   . Cancer Brother   . Skin cancer Brother        basal cell  . Skin cancer Sister        basal cell    ROS- All systems are reviewed and negative except as per the HPI above.  Physical Exam: Vitals:   02/25/18 1006  BP: 130/72  Pulse: 90  Weight: 248 lb (112.5 kg)  Height: 6' (1.829 m)    GEN- The patient is well appearing, alert and oriented x 3 today.   Head- normocephalic, atraumatic Eyes-  Sclera clear, conjunctiva pink Ears- hearing intact Oropharynx- clear Neck- supple  Lungs- Clear to ausculation bilaterally, normal work of breathing Heart- irregular rate and rhythm, no murmurs, rubs or gallops  GI- soft, NT, ND, + BS Extremities- no clubbing, cyanosis, or edema MS- no significant deformity or atrophy Skin- no rash or lesion Psych- euthymic mood, full affect Neuro- strength and sensation are intact  Wt Readings from Last 3 Encounters:  02/25/18 248 lb (112.5 kg)  02/11/18 251 lb (113.9 kg)  12/22/11 275 lb 8 oz (125 kg)    EKG today demonstrates afib, V rate 90 bpm, QRS 80 msec, QTc 420 msec Echo 02/08/18 demonstrated EF is normal, LA 47 mm, no significant valvular disease  Epic records are reviewed at length today  Assessment and Plan:  1. Persistent atrial fibrillation Symptomatic afib Rate controlled Continue xarelto for CHADS2VASC of 1 Will proceed with cardioversion.  Risks of procedure discussed with the patient today. If he has additional AF then we will consider AAD (multaq or flecainide) at that time.  2. HTN Stable No change required today  Return to AF clinic 1 week after cardioversion  Thompson Grayer, MD 02/25/2018 11:10 AM

## 2018-02-25 NOTE — Patient Instructions (Signed)
Cardioversion scheduled for Tuesday, June 25th  - Arrive at the [facility] and go to admitting at [place] not eat or drink anything after midnight the night prior to your procedure.  - Take all your morning medication with a sip of water prior to arrival.  - You will not be able to drive home after your procedure.

## 2018-02-25 NOTE — Progress Notes (Signed)
Primary Care Physician: Harlan Stains, MD Primary Electrophysiologist: none  Calvin Collins is a 62 y.o. male with a history of persistent atrial fibrillation who presents for follow up in the [facility].  Since last being seen in clinic, the patient reports doing reasonably well.  He remains in afib.  + palpitaitons.  Compliant with xarelto.  Today, he  denies symptoms of palpitations, chest pain, shortness of breath, orthopnea, PND, lower extremity edema, dizziness, presyncope, syncope, snoring, daytime somnolence, bleeding, or neurologic sequela. The patient is tolerating medications without difficulties and is otherwise without complaint today.    Atrial Fibrillation Risk Factors:  he does not have symptoms or diagnosis of sleep apnea.  he does not have a history of rheumatic fever.  he does not have a history of alcohol use.  he has a BMI of Body mass index is 33.63 kg/m.Marland Kitchen Filed Weights   02/25/18 1006  Weight: 248 lb (112.5 kg)    LA size: 47 mm   Atrial Fibrillation Management history:  Previous antiarrhythmic drugs: none  Previous cardioversions: none  Previous ablations: none  CHADS2VASC score: 1  Anticoagulation history: on xarelto since 02/03/18   Past Medical History:  Diagnosis Date  . Anxiety   . ED (erectile dysfunction)   . Hypertension   . Obesity   . Persistent atrial fibrillation ([place]) 02/02/2018   Past Surgical History:  Procedure Laterality Date  . left arm surgery    . skin cancer removed      Current Outpatient Medications  Medication Sig Dispense Refill  . albuterol (PROVENTIL HFA;VENTOLIN HFA) 108 (90 Base) MCG/ACT inhaler Inhale into the lungs every 6 (six) hours as needed for wheezing or shortness of breath.    . budesonide (PULMICORT) 180 MCG/ACT inhaler Inhale into the lungs 2 (two) times daily.    Marland Kitchen LORazepam (ATIVAN) 1 MG tablet as needed. Seldom takes this    . nebivolol (BYSTOLIC) 2.5 MG tablet  Take 2.5 mg by mouth daily.    . rivaroxaban (XARELTO) 20 MG TABS tablet Take 1 tablet (20 mg total) by mouth daily with supper. 30 tablet 3  . sildenafil (VIAGRA) 50 MG tablet Take 50 mg by mouth daily as needed for erectile dysfunction.     No current facility-administered medications for this encounter.     No Known Allergies  Social History   Socioeconomic History  . Marital status: Married    Spouse name: Not on file  . Number of children: 4  . Years of education: Not on file  . Highest education level: Not on file  Occupational History  . Occupation: Secretary/administrator Professor  Social Needs  . Financial resource strain: Not on file  . Food insecurity:    Worry: Not on file    Inability: Not on file  . Transportation needs:    Medical: Not on file    Non-medical: Not on file  Tobacco Use  . Smoking status: Never Smoker  . Smokeless tobacco: Never Used  Substance and Sexual Activity  . Alcohol use: Yes    Comment: 4-5 times weekly  . Drug use: No  . Sexual activity: Not on file  Lifestyle  . Physical activity:    Days per week: Not on file    Minutes per session: Not on file  . Stress: Not on file  Relationships  . Social connections:    Talks on phone: Not on file    Gets together: Not on file  Attends religious service: Not on file    Active member of club or organization: Not on file    Attends meetings of clubs or organizations: Not on file    Relationship status: Not on file  . Intimate partner violence:    Fear of current or ex partner: Not on file    Emotionally abused: Not on file    Physically abused: Not on file    Forced sexual activity: Not on file  Other Topics Concern  . Not on file  Social History Narrative   Lives in [place].  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  Attends [employer]    Family History  Problem Relation Age of Onset  . Emphysema Father   . Emphysema Paternal Uncle   . Other Daughter        liver  transplant  . Hypertension Mother   . Hypertension Father   . Stroke Mother   . Cancer Brother   . Skin cancer Brother        basal cell  . Skin cancer Sister        basal cell    ROS- All systems are reviewed and negative except as per the HPI above.  Physical Exam: Vitals:   02/25/18 1006  BP: 130/72  Pulse: 90  Weight: 248 lb (112.5 kg)  Height: 6' (1.829 m)    GEN- The patient is well appearing, alert and oriented x 3 today.   Head- normocephalic, atraumatic Eyes-  Sclera clear, conjunctiva pink Ears- hearing intact Oropharynx- clear Neck- supple  Lungs- Clear to ausculation bilaterally, normal work of breathing Heart- irregular rate and rhythm, no murmurs, rubs or gallops  GI- soft, NT, ND, + BS Extremities- no clubbing, cyanosis, or edema MS- no significant deformity or atrophy Skin- no rash or lesion Psych- euthymic mood, full affect Neuro- strength and sensation are intact  Wt Readings from Last 3 Encounters:  02/25/18 248 lb (112.5 kg)  02/11/18 251 lb (113.9 kg)  12/22/11 275 lb 8 oz (125 kg)    EKG today demonstrates afib, V rate 90 bpm, QRS 80 msec, QTc 420 msec Echo 02/08/18 demonstrated EF is normal, LA 47 mm, no significant valvular disease  Epic records are reviewed at length today  Assessment and Plan:  1. Persistent atrial fibrillation Symptomatic afib Rate controlled Continue xarelto for CHADS2VASC of 1 Will proceed with cardioversion.  Risks of procedure discussed with the patient today. If he has additional AF then we will consider AAD (multaq or flecainide) at that time.  2. HTN Stable No change required today  Return to AF clinic 1 week after cardioversion  Thompson Grayer, MD 02/25/2018 11:10 AM

## 2018-03-02 ENCOUNTER — Ambulatory Visit (HOSPITAL_COMMUNITY)
Admission: RE | Admit: 2018-03-02 | Discharge: 2018-03-02 | Disposition: A | Discharge status: Home / Self Care | Payer: BC Managed Care – PPO | Source: Ambulatory Visit | Attending: Cardiology | Admitting: Cardiology

## 2018-03-02 ENCOUNTER — Other Ambulatory Visit: Payer: Self-pay

## 2018-03-02 ENCOUNTER — Encounter (HOSPITAL_COMMUNITY): Payer: Self-pay | Admitting: Anesthesiology

## 2018-03-02 ENCOUNTER — Ambulatory Visit (HOSPITAL_COMMUNITY): Payer: BC Managed Care – PPO | Admitting: Anesthesiology

## 2018-03-02 ENCOUNTER — Encounter (HOSPITAL_COMMUNITY)
Admission: RE | Disposition: A | Discharge status: Home / Self Care | Payer: Self-pay | Source: Ambulatory Visit | Attending: Cardiology

## 2018-03-02 DIAGNOSIS — Z7951 Long term (current) use of inhaled steroids: Secondary | ICD-10-CM | POA: Insufficient documentation

## 2018-03-02 DIAGNOSIS — I10 Essential (primary) hypertension: Secondary | ICD-10-CM | POA: Diagnosis not present

## 2018-03-02 DIAGNOSIS — I481 Persistent atrial fibrillation: Secondary | ICD-10-CM | POA: Insufficient documentation

## 2018-03-02 DIAGNOSIS — Z7902 Long term (current) use of antithrombotics/antiplatelets: Secondary | ICD-10-CM | POA: Insufficient documentation

## 2018-03-02 DIAGNOSIS — Z79899 Other long term (current) drug therapy: Secondary | ICD-10-CM | POA: Insufficient documentation

## 2018-03-02 DIAGNOSIS — I484 Atypical atrial flutter: Secondary | ICD-10-CM | POA: Diagnosis not present

## 2018-03-02 DIAGNOSIS — Z9889 Other specified postprocedural states: Secondary | ICD-10-CM | POA: Insufficient documentation

## 2018-03-02 DIAGNOSIS — E669 Obesity, unspecified: Secondary | ICD-10-CM | POA: Diagnosis not present

## 2018-03-02 DIAGNOSIS — F419 Anxiety disorder, unspecified: Secondary | ICD-10-CM | POA: Insufficient documentation

## 2018-03-02 DIAGNOSIS — N529 Male erectile dysfunction, unspecified: Secondary | ICD-10-CM | POA: Diagnosis not present

## 2018-03-02 DIAGNOSIS — Z85828 Personal history of other malignant neoplasm of skin: Secondary | ICD-10-CM | POA: Diagnosis not present

## 2018-03-02 DIAGNOSIS — Z823 Family history of stroke: Secondary | ICD-10-CM | POA: Diagnosis not present

## 2018-03-02 DIAGNOSIS — Z808 Family history of malignant neoplasm of other organs or systems: Secondary | ICD-10-CM | POA: Insufficient documentation

## 2018-03-02 DIAGNOSIS — I4891 Unspecified atrial fibrillation: Secondary | ICD-10-CM | POA: Diagnosis present

## 2018-03-02 DIAGNOSIS — Z8249 Family history of ischemic heart disease and other diseases of the circulatory system: Secondary | ICD-10-CM | POA: Insufficient documentation

## 2018-03-02 HISTORY — PX: CARDIOVERSION: SHX1299

## 2018-03-02 SURGERY — CARDIOVERSION
Anesthesia: General

## 2018-03-02 MED ORDER — PROPOFOL 10 MG/ML IV BOLUS
INTRAVENOUS | Status: DC | PRN
  Administered 2018-03-02 (×2): 50 mg via INTRAVENOUS

## 2018-03-02 MED ORDER — LIDOCAINE HCL (CARDIAC) PF 100 MG/5ML IV SOSY
PREFILLED_SYRINGE | INTRAVENOUS | Status: DC | PRN
  Administered 2018-03-02: 50 mg via INTRAVENOUS

## 2018-03-02 MED ORDER — SODIUM CHLORIDE 0.9 % IV SOLN
INTRAVENOUS | Status: DC
Start: 2018-03-02 — End: 2018-03-02
  Administered 2018-03-02: 10:00:00 via INTRAVENOUS

## 2018-03-02 NOTE — Anesthesia Postprocedure Evaluation (Signed)
Anesthesia Post Note  Patient: Calvin Collins  Procedure(s) Performed: CARDIOVERSION (N/A )     Patient location during evaluation: PACU Anesthesia Type: General Level of consciousness: awake and alert and oriented Pain management: pain level controlled Vital Signs Assessment: post-procedure vital signs reviewed and stable Respiratory status: spontaneous breathing, nonlabored ventilation and respiratory function stable Cardiovascular status: blood pressure returned to baseline and stable Postop Assessment: no apparent nausea or vomiting Anesthetic complications: no    Last Vitals:  Vitals:   03/02/18 1011 03/02/18 1020  BP: 128/75 119/83  Pulse: (!) 57 (!) 54  Resp: 15 12  Temp: 37.1 C   SpO2: 100% 97%    Last Pain:  Vitals:   03/02/18 1020  TempSrc:   PainSc: 0-No pain                 Delmo Matty A.

## 2018-03-02 NOTE — Anesthesia Procedure Notes (Signed)
Procedure Name: MAC Date/Time: 03/02/2018 10:08 AM Performed by: Teressa Lower., CRNA Pre-anesthesia Checklist: Patient identified, Emergency Drugs available, Suction available, Patient being monitored and Timeout performed Patient Re-evaluated:Patient Re-evaluated prior to induction Oxygen Delivery Method: Nasal cannula

## 2018-03-02 NOTE — CV Procedure (Signed)
    Electrical Cardioversion Procedure Note Calvin Collins [id number] 05/22/1956  Procedure: Electrical Cardioversion Indications:  Atrial Fibrillation  Time Out: Verified patient identification, verified procedure,medications/allergies/relevent history reviewed, required imaging and test results available.  Performed  Procedure Details  The patient was NPO after midnight. Anesthesia was administered at the beside  by Dr.Foster  with 100 mg of propofol.  Cardioversion was performed with synchronized biphasic defibrillation via AP pads with 120, 150 joules.  2 attempt(s) were performed.  The patient converted to normal sinus rhythm/ sinus bradycardia . The patient tolerated the procedure well   IMPRESSION:  Successful cardioversion of atrial fibrillation   Calvin Collins 03/02/2018, 10:12 AM

## 2018-03-02 NOTE — Transfer of Care (Signed)
Immediate Anesthesia Transfer of Care Note  Patient: Calvin Collins  Procedure(s) Performed: CARDIOVERSION (N/A )  Patient Location: Endoscopy Unit  Anesthesia Type:MAC  Level of Consciousness: awake, alert  and oriented  Airway & Oxygen Therapy: Patient Spontanous Breathing and Patient connected to nasal cannula oxygen  Post-op Assessment: Report given to RN and Post -op Vital signs reviewed and stable  Post vital signs: Reviewed and stable  Last Vitals:  Vitals Value Taken Time  BP    Temp    Pulse    Resp    SpO2      Last Pain:  Vitals:   03/02/18 0913  TempSrc: Oral  PainSc: 0-No pain         Complications: No apparent anesthesia complications

## 2018-03-02 NOTE — Anesthesia Preprocedure Evaluation (Signed)
Anesthesia Evaluation    Airway Mallampati: II  TM Distance: >3 FB Neck ROM: Full    Dental  (+) Teeth Intact   Pulmonary asthma ,    Pulmonary exam normal breath sounds clear to auscultation       Cardiovascular hypertension, Pt. on medications and Pt. on home beta blockers + dysrhythmias Atrial Fibrillation  Rhythm:Irregular Rate:Normal     Neuro/Psych Anxiety negative neurological ROS     GI/Hepatic negative GI ROS, Neg liver ROS,   Endo/Other  Obesity  Renal/GU negative Renal ROS  negative genitourinary   Musculoskeletal negative musculoskeletal ROS (+)   Abdominal (+) + obese,   Peds  Hematology Xarelto- last dose   Anesthesia Other Findings   Reproductive/Obstetrics                             Anesthesia Physical Anesthesia Plan  ASA: III  Anesthesia Plan: General   Post-op Pain Management:    Induction: Intravenous  PONV Risk Score and Plan: 2 and Ondansetron and Treatment may vary due to age or medical condition  Airway Management Planned: Mask  Additional Equipment:   Intra-op Plan:   Post-operative Plan:   Informed Consent: I have reviewed the patients History and Physical, chart, labs and discussed the procedure including the risks, benefits and alternatives for the proposed anesthesia with the patient or authorized representative who has indicated his/her understanding and acceptance.     Plan Discussed with:   Anesthesia Plan Comments:         Anesthesia Quick Evaluation

## 2018-03-02 NOTE — Discharge Instructions (Signed)
Electrical Cardioversion, Care After °This sheet gives you information about how to care for yourself after your procedure. Your health care provider may also give you more specific instructions. If you have problems or questions, contact your health care provider. °What can I expect after the procedure? °After the procedure, it is common to have: °· Some redness on the skin where the shocks were given. ° °Follow these instructions at home: °· Do not drive for 24 hours if you were given a medicine to help you relax (sedative). °· Take over-the-counter and prescription medicines only as told by your health care provider. °· Ask your health care provider how to check your pulse. Check it often. °· Rest for 48 hours after the procedure or as told by your health care provider. °· Avoid or limit your caffeine use as told by your health care provider. °Contact a health care provider if: °· You feel like your heart is beating too quickly or your pulse is not regular. °· You have a serious muscle cramp that does not go away. °Get help right away if: °· You have discomfort in your chest. °· You are dizzy or you feel faint. °· You have trouble breathing or you are short of breath. °· Your speech is slurred. °· You have trouble moving an arm or leg on one side of your body. °· Your fingers or toes turn cold or blue. °This information is not intended to replace advice given to you by your health care provider. Make sure you discuss any questions you have with your health care provider. °Document Released: 06/15/2013 Document Revised: 03/28/2016 Document Reviewed: 02/29/2016 °Elsevier Interactive Patient Education © 2018 Elsevier Inc. ° °

## 2018-03-02 NOTE — Interval H&P Note (Signed)
History and Physical Interval Note:  03/02/2018 9:40 AM  Calvin Collins  has presented today for surgery, with the diagnosis of AFIB  The various methods of treatment have been discussed with the patient and family. After consideration of risks, benefits and other options for treatment, the patient has consented to  Procedure(s): CARDIOVERSION (N/A) as a surgical intervention .  The patient's history has been reviewed, patient examined, no change in status, stable for surgery.  I have reviewed the patient's chart and labs.  Questions were answered to the patient's satisfaction.     [employer]

## 2018-03-03 ENCOUNTER — Encounter (HOSPITAL_COMMUNITY): Payer: Self-pay | Admitting: Cardiology

## 2018-03-05 ENCOUNTER — Encounter: Payer: Self-pay | Admitting: *Deleted

## 2018-03-05 ENCOUNTER — Telehealth: Payer: Self-pay | Admitting: *Deleted

## 2018-03-05 NOTE — Telephone Encounter (Signed)
error 

## 2018-03-08 ENCOUNTER — Other Ambulatory Visit: Payer: Self-pay

## 2018-03-08 ENCOUNTER — Ambulatory Visit: Payer: BC Managed Care – PPO | Admitting: Neurology

## 2018-03-08 ENCOUNTER — Telehealth: Payer: Self-pay | Admitting: Neurology

## 2018-03-08 ENCOUNTER — Encounter: Payer: Self-pay | Admitting: Neurology

## 2018-03-08 VITALS — BP 147/94 | HR 94 | Resp 18 | Ht 72.0 in | Wt 246.2 lb

## 2018-03-08 DIAGNOSIS — M5104 Intervertebral disc disorders with myelopathy, thoracic region: Secondary | ICD-10-CM | POA: Diagnosis not present

## 2018-03-08 DIAGNOSIS — R531 Weakness: Secondary | ICD-10-CM | POA: Diagnosis not present

## 2018-03-08 DIAGNOSIS — R102 Pelvic and perineal pain: Secondary | ICD-10-CM | POA: Diagnosis not present

## 2018-03-08 DIAGNOSIS — M546 Pain in thoracic spine: Secondary | ICD-10-CM | POA: Diagnosis not present

## 2018-03-08 DIAGNOSIS — R29898 Other symptoms and signs involving the musculoskeletal system: Secondary | ICD-10-CM | POA: Diagnosis not present

## 2018-03-08 DIAGNOSIS — R202 Paresthesia of skin: Secondary | ICD-10-CM | POA: Diagnosis not present

## 2018-03-08 DIAGNOSIS — G543 Thoracic root disorders, not elsewhere classified: Secondary | ICD-10-CM

## 2018-03-08 DIAGNOSIS — R200 Anesthesia of skin: Secondary | ICD-10-CM | POA: Diagnosis not present

## 2018-03-08 NOTE — Patient Instructions (Signed)
Emg/NCS  Electromyoneurogram Electromyoneurogram is a test to check how well your muscles and nerves are working. This procedure includes the combined use of electromyogram (EMG) and nerve conduction study (NCS). EMG is used to look for muscular disorders. NCS, which is also called electroneurogram, measures how well your nerves are controlling your muscles. The procedures are usually performed together to check if your muscles and nerves are healthy. If the reaction to testing is abnormal, this can indicate disease or injury, such as peripheral nerve damage. Tell a health care provider about:  Any allergies you have.  All medicines you are taking, including vitamins, herbs, eye drops, creams, and over-the-counter medicines.  Any problems you or family members have had with anesthetic medicines.  Any blood disorders you have.  Any surgeries you have had.  Any medical conditions you have.  Any pacemaker you have. What are the risks? Generally, this is a safe procedure. However, problems may occur, including:  Infection where the electrodes were inserted.  Bleeding.  What happens before the procedure?  Ask your health care provider about: ? Changing or stopping your regular medicines. This is especially important if you are taking diabetes medicines or blood thinners. ? Taking medicines such as aspirin and ibuprofen. These medicines can thin your blood. Do not take these medicines before your procedure if your health care provider instructs you not to.  Your health care provider may ask you to avoid: ? Caffeine, such as coffee and tea. ? Nicotine. This includes cigarettes and anything with tobacco.  Do not use lotions or creams on the same day that you will be having the procedure. What happens during the procedure? For EMG:  Your health care provider will ask you to stay in a position so that he or she can access the muscle that will be studied. You may be standing, sitting  down, or lying down.  You may be given a medicine that numbs the area (local anesthetic).  A very thin needle that has an electrode on it will be inserted into your muscle.  Another small electrode will be placed on your skin near the muscle.  Your health care provider will ask you to continue to remain still.  The electrodes will send a signal that tells about the electrical activity of your muscles. You may see this on a monitor or hear it in the room.  After your muscles have been studied at rest, your health care provider will ask you to contract or flex your muscles. The electrodes will send a signal that tells about the electrical activity of your muscles.  Your health care provider will remove the electrodes and the electrode needles when the procedure is finished. The procedure may vary among health care providers and hospitals. For NCS:  An electrode that records your nerve activity (recording electrode) will be placed on your skin by the muscle that is being studied.  An electrode that is used as a reference (reference electrode) will be placed near the recording electrode.  A paste or gel will be applied to your skin between the recording electrode and the reference electrode.  Your nerve will be stimulated with a mild shock. Your health care provider will measure how much time it takes for your muscle to react.  Your health care provider will remove the electrodes and the gel when the procedure is finished. The procedure may vary among health care providers and hospitals. What happens after the procedure?  It is your responsibility to obtain  your test results. Ask your health care provider or the department performing the test when and how you will get your results.  Your health care provider may: ? Give you medicines for any pain. ? Monitor the insertion sites to make sure that they stop bleeding. This information is not intended to replace advice given to you by your  health care provider. Make sure you discuss any questions you have with your health care provider. Document Released: 12/26/2004 Document Revised: 01/31/2016 Document Reviewed: 10/16/2014 Elsevier Interactive Patient Education  2018 [employer].

## 2018-03-08 NOTE — Telephone Encounter (Signed)
I have spoke with the front desk to help assist me in trying to find a sooner apt. They were able to schedule him with you aug 15th. All the other MD's are booking out that far as well so would not be able to work him in sooner with another MD either. I have requested he be put on the bump list so that if you have a cancellation we can call to get him seen sooner hopefully.

## 2018-03-08 NOTE — Telephone Encounter (Signed)
Calvin Collins, can you help me find a sooner emg/ncs for patient please? Let's discuss thanks

## 2018-03-08 NOTE — Progress Notes (Signed)
[facility]    Provider:  Dr Jaynee Eagles Referring Provider: Harlan Stains, MD Primary Care Physician:  Harlan Stains, MD  CC:  Paresthesias, leg weakness  HPI:  Calvin Collins is a 62 y.o. male here as a referral from Dr. Dema Severin for paresthesias.  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  He is on Xarelto for A. Fib. Here with his wife who also provides information. Last summer he started having symptoms, several months prior he hit his pelvis and head "pretty hard" but recovered. Lats smmer he started having radiating tingling and burning pain in his pelvis bilaterally. He had CT and MRI. The pain resolved nad he was back to normal. The middle of April of this year he started having abdominal pain on the right side, severe pain and bloating and burning pain, had more testing including ultrasound. He saw GI and has an [place], CT abdomen. Since then he has radiating burning tingling pain from the pelvis down, the right leg is the worse, numbness from the knee down, lots of burning and tingling even in his jaws. Always later int he day, after being more active worsening. Wife provides much information. Feels better in the morning. He also has "shocks" around his body even the upper extremity. Started Xarelto 29th of May is in afib. The shooting pain didn;t start until the Xarelto.   - He has dull back pain, some pain in the glut but starts in the abdomen pelvis and radiates down down the anterior thighs bilaterally right > left and the front of the calves to the top of the feet. Weakness in the legs.  Also joint pain esp in the ankles.  Right leg: Radiates from the knee down the anterior shin to the top of the toes.   Reviewed notes, labs and imaging from outside  physicians, which showed:  Reviewed referring physician notes.  Patient was seen for right upper quadrant pain, had a complete abdominal ultrasound and abdominal CT scan with contrast showed no acute abnormalities to explain right upper upper quadrant pain.  No gallstones no liver pathology.  Chest x-ray showed an old right seventh rib fracture.  CBC CMP and lipase results were normal.  Lumbar MRI a year ago with mild degenerative changes.  He reports right upper quadrant pain that radiates to his right side and right mid back.  Started after he fell in 2017.  The pain returned and then went away April 2019.  Pain is better supine, better at night and worse during the day when he is up and moving.  Not affected by eating.  Denies heartburn, dysphagia, nausea, vomiting, melena or hematochezia.  He has had some abdominal bloating and episodes of chronic intermittent diarrhea.  He had a prior colonoscopy done in 2017 the next one due in 2020.  EGD was also completed which showed eosinophilic esophagitis and a small 2 mm gastric ulcer treated with Nexium.  He has been off PPI therapy doing well.  He reports a lot of anxiety and weight loss in the past few months.  Weight down 6.5 pounds in the past month.  BMI 34.  He also developed atrial fibrillation and is on Xarelto.  The right upper quadrant pain started in April of approximately of 2019. He does drink 5+ beer wine or liquor weekly 1-2 every third night.  No recreational drug use.  Never smoker.  He tries to exercise. Laying down helps. Being on his feet makes it worse. No Fhx of neuromuscular issues. Father with parkinson's disease. Denies fatigue. Denies excessive daytime sleepiness. He is under stress. He is going to orthopaedics soon fro evaluation.  No significant facial weakness, no muscle wasting.   Personally reviewed MRI of the lumbar spine which showed minimal lumbar degenerative changes no stenosis centrally or neural foraminal impingement.  Review  of Systems: Patient complains of symptoms per HPI as well as the following symptoms: paresthesias, leg weakness. Pertinent negatives and positives per HPI. All others negative.   Social History   Socioeconomic History  . Marital status: Married    Spouse name: Not on file  . Number of children: 4  . Years of education: Not on file  . Highest education level: Not on file  Occupational History  . Occupation: College Professor  Social Needs  . Financial resource strain: Not on file  . Food insecurity:    Worry: Not on file    Inability: Not on file  . Transportation needs:    Medical: Not on file    Non-medical: Not on file  Tobacco Use  . Smoking status: Never Smoker  . Smokeless tobacco: Never Used  Substance and Sexual Activity  . Alcohol use: Yes    Comment: 4-5 times weekly  . Drug use: No  . Sexual activity: Not on file  Lifestyle  . Physical activity:    Days per week: Not on file    Minutes per session: Not on file  . Stress: Not on file  Relationships  . Social connections:    Talks on phone: Not on file    Gets together: Not on file    Attends religious service: Not on file    Active member of club or organization: Not on file    Attends meetings of clubs or organizations: Not on file    Relationship status: Not on file  . Intimate partner violence:    Fear of current or ex partner: Not on file    Emotionally abused: Not on file    Physically abused: Not on file    Forced sexual activity: Not on file  Other Topics Concern  . Not on file  Social History Narrative   Lives in [place].  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  Attends [employer]    Family History  Problem Relation Age of Onset  . Emphysema Father   . Hypertension Father   . Emphysema Paternal Uncle   . Other Daughter        liver transplant  . Hypertension Mother   . Stroke Mother   . Cancer Brother   . Skin cancer Brother        basal cell  . Skin cancer Sister         basal cell    Past Medical History:  Diagnosis Date  . Anxiety   . Asthmatic bronchitis   . ED (erectile dysfunction)   . GERD (gastroesophageal reflux disease)   . History of colon polyps   . Hypercholesteremia   . Hypertension   . Obesity   . Persistent atrial fibrillation (HCC) 02/02/2018  . Squamous cell carcinoma of skin   . SVT (supraventricular tachycardia) (HCC)     Past Surgical History:  Procedure Laterality Date  . CARDIOVERSION N/A 03/02/2018   Procedure: CARDIOVERSION;  Surgeon: Skains, Mark C, MD;  Location: [facility] ENDOSCOPY;  Service:   Cardiovascular;  Laterality: N/A;  . left arm surgery    . skin cancer removed    . WISDOM TOOTH EXTRACTION      Current Outpatient Medications  Medication Sig Dispense Refill  . albuterol (PROVENTIL HFA;VENTOLIN HFA) 108 (90 Base) MCG/ACT inhaler Inhale into the lungs every 6 (six) hours as needed for wheezing or shortness of breath.    . budesonide (PULMICORT) 180 MCG/ACT inhaler Inhale 1-2 puffs into the lungs daily.     . LORazepam (ATIVAN) 1 MG tablet Take 1 mg by mouth 2 (two) times daily as needed for anxiety. Seldom takes this    . nebivolol (BYSTOLIC) 2.5 MG tablet Take 2.5 mg by mouth daily.    . rivaroxaban (XARELTO) 20 MG TABS tablet Take 1 tablet (20 mg total) by mouth daily with supper. 30 tablet 3  . sildenafil (VIAGRA) 50 MG tablet Take 50 mg by mouth daily as needed for erectile dysfunction.    . vardenafil (LEVITRA) 20 MG tablet Take 20 mg by mouth daily as needed for erectile dysfunction.    . Probiotic Product (ALIGN) 4 MG CAPS Take 1 capsule by mouth daily.     No current facility-administered medications for this visit.     Allergies as of 03/08/2018 - Review Complete 03/08/2018  Allergen Reaction Noted  . Paroxetine  03/05/2018    Vitals: BP (!) 147/94   Pulse 94   Resp 18   Ht 6' (1.829 m)   Wt 246 lb 3.2 oz (111.7 kg)   BMI 33.39 kg/m  Last Weight:  Wt Readings from Last 1 Encounters:    03/08/18 246 lb 3.2 oz (111.7 kg)   Last Height:   Ht Readings from Last 1 Encounters:  03/08/18 6' (1.829 m)   Physical exam: Exam: Gen: NAD, conversant, well nourised, obese, well groomed                     CV: RRR, no MRG. No Carotid Bruits. No peripheral edema, warm, nontender Eyes: Conjunctivae clear without exudates or hemorrhage  Neuro: Detailed Neurologic Exam  Speech:    Speech is normal; fluent and spontaneous with normal comprehension.  Cognition:    The patient is oriented to person, place, and time;     recent and remote memory intact;     language fluent;     normal attention, concentration,     fund of knowledge Cranial Nerves:    The pupils are equal, round, and reactive to light. The fundi are normal and spontaneous venous pulsations are present. Visual fields are full to finger confrontation. Extraocular movements are intact. Trigeminal sensation is intact and the muscles of mastication are normal. The face is symmetric. The palate elevates in the midline. Hearing intact. Voice is normal. Shoulder shrug is normal. The tongue has normal motion without fasciculations.   Coordination:    Normal finger to nose and heel to shin. Normal rapid alternating movements.   Gait:    Heel-toe and tandem gait are normal.   Motor Observation:    No asymmetry, no atrophy, and no involuntary movements noted. Tone:    Normal muscle tone.    Posture:    Posture is normal. normal erect    Strength:    Strength is V/V in the upper and lower limbs.      Sensation: intact to LT     Reflex Exam:  DTR's:    Deep tendon reflexes in the upper and lower extremities are normal   bilaterally.   Toes:    The toes are downgoing bilaterally.   Clonus:    Clonus is absent.       Assessment/Plan:  62 y.o. male here as a referral from Dr. Dema Severin for paresthesias.  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  He is on Xarelto for A. Fib. He has an interesting constellaiton of symptoms which started with radiating pain around T8 level and progressed to burning in the pelvis, back pain with radiation into the bilateral extremities.  He also has paresthesias in the upper body arms and face.  Emg/ncs bilateral lowers and one arm to look for peripheral causes Need MRI of the thoracic spine given T8 sensory level for lesions of the thoracic spinal cord or myelopathy or Thoracic radiculopathy. CT of the abdomen and pelvis have been negative however given thoracic dermatomal pain (possibly shingles without rashes?), may need to evaluate for plexopathy with MRI of the plexus as well as lumbar puncture to rule out any infectious causes. We will perform a complete serum neuropathy panel for other causes of paresthesias, weakness, radiating pain, numbness. Can consider MRI cervical spine and MRI brain if workup unrevealing  Orders Placed This Encounter  Procedures  . MR THORACIC SPINE WO CONTRAST  . C-reactive protein  . Sedimentation rate  . Ferritin  . TSH  . HIV antibody  . ANA w/Reflex  . Sjogren's syndrome antibods(ssa + ssb)  . Pan-ANCA  . B12 and Folate Panel  . RPR  . Hepatitis C antibody  . Rheumatoid factor  . Heavy metals, blood  . Vitamin B6  . Multiple Myeloma Panel (SPEP&IFE w/QIG)  . B. burgdorfi Antibody  . Vitamin B1  . NCV with EMG(electromyography)   Cc: Dr. Alison Murray, [facility] [address] [place], [place] [address]  Phone [phone] Fax [phone]

## 2018-03-09 ENCOUNTER — Telehealth: Payer: Self-pay | Admitting: Neurology

## 2018-03-09 ENCOUNTER — Encounter (HOSPITAL_COMMUNITY): Payer: Self-pay | Admitting: Nurse Practitioner

## 2018-03-09 ENCOUNTER — Ambulatory Visit (HOSPITAL_COMMUNITY)
Admission: RE | Admit: 2018-03-09 | Discharge: 2018-03-09 | Disposition: A | Discharge status: Home / Self Care | Payer: BC Managed Care – PPO | Source: Ambulatory Visit | Attending: Nurse Practitioner | Admitting: Nurse Practitioner

## 2018-03-09 VITALS — BP 144/86 | HR 81 | Ht 72.0 in | Wt 244.2 lb

## 2018-03-09 DIAGNOSIS — E7800 Pure hypercholesterolemia, unspecified: Secondary | ICD-10-CM | POA: Diagnosis not present

## 2018-03-09 DIAGNOSIS — E669 Obesity, unspecified: Secondary | ICD-10-CM | POA: Diagnosis not present

## 2018-03-09 DIAGNOSIS — N529 Male erectile dysfunction, unspecified: Secondary | ICD-10-CM | POA: Diagnosis not present

## 2018-03-09 DIAGNOSIS — Z7901 Long term (current) use of anticoagulants: Secondary | ICD-10-CM | POA: Insufficient documentation

## 2018-03-09 DIAGNOSIS — K219 Gastro-esophageal reflux disease without esophagitis: Secondary | ICD-10-CM | POA: Insufficient documentation

## 2018-03-09 DIAGNOSIS — Z86010 Personal history of colonic polyps: Secondary | ICD-10-CM | POA: Insufficient documentation

## 2018-03-09 DIAGNOSIS — I481 Persistent atrial fibrillation: Secondary | ICD-10-CM

## 2018-03-09 DIAGNOSIS — I10 Essential (primary) hypertension: Secondary | ICD-10-CM | POA: Diagnosis not present

## 2018-03-09 DIAGNOSIS — F419 Anxiety disorder, unspecified: Secondary | ICD-10-CM | POA: Diagnosis not present

## 2018-03-09 DIAGNOSIS — I471 Supraventricular tachycardia: Secondary | ICD-10-CM | POA: Diagnosis not present

## 2018-03-09 DIAGNOSIS — J45909 Unspecified asthma, uncomplicated: Secondary | ICD-10-CM | POA: Insufficient documentation

## 2018-03-09 DIAGNOSIS — Z79899 Other long term (current) drug therapy: Secondary | ICD-10-CM | POA: Diagnosis not present

## 2018-03-09 DIAGNOSIS — Z85828 Personal history of other malignant neoplasm of skin: Secondary | ICD-10-CM | POA: Insufficient documentation

## 2018-03-09 DIAGNOSIS — I4819 Other persistent atrial fibrillation: Secondary | ICD-10-CM

## 2018-03-09 DIAGNOSIS — Z8249 Family history of ischemic heart disease and other diseases of the circulatory system: Secondary | ICD-10-CM | POA: Insufficient documentation

## 2018-03-09 MED ORDER — APIXABAN 5 MG PO TABS: 5.0000 mg | ORAL_TABLET | Freq: Two times a day (BID) | ORAL | 0 refills | Status: DC

## 2018-03-09 NOTE — Telephone Encounter (Signed)
Can use xanax, but needs a driver. -VRP

## 2018-03-09 NOTE — Telephone Encounter (Signed)
lvm for pt to call back about scheduling MRI  BCBS Auth: [id number] (exp. 03/08/18 to 04/06/18)

## 2018-03-09 NOTE — Patient Instructions (Addendum)
Start Eliquis 5 mg tonight instead of taking you Xarelto 20 mg.  Start taking Eliquis 5 mg twice a day starting tomorrow.  Follow up with Butch Penny on July 29th.    I will provide samples for this month

## 2018-03-09 NOTE — Telephone Encounter (Signed)
I called pt and he will check to see with wife about coming with him and may call back on Monday to let us know he needs xanax.  (noted has ativan).

## 2018-03-09 NOTE — Progress Notes (Signed)
Primary Care Physician: Harlan Stains, MD Primary Electrophysiologist: none  Calvin Collins is a 62 y.o. male with a history of persistent atrial fibrillation who presents for follow up in the [facility].  Since last being seen in clinic, the patient reports doing reasonably well.  He remains in afib.  + palpitaitons.  Compliant with xarelto.  He had a scheduled cardioversion 6/25, whicvh unfortunately only lasted 48 hours. He is not symptomatic with afib and only know as he tracks his HR. He is here to discuss options as to what is next.He was seen recently by Neurology for some paresthesias mostly affecting lower legs. He feels these symptoms started about the same time of the xarelto and would like to switch to eliquis to see if symptoms will improve, otherwise looking at more extensive neurologic w/u. He is also planning to go to the beach the 3rd week of July . He is interested in antiarrythmic therapy but would like to wait until he returns from vacation.   Today, he  denies symptoms of palpitations, chest pain, shortness of breath, orthopnea, PND, lower extremity edema, dizziness, presyncope, syncope, snoring, daytime somnolence, bleeding, or neurologic sequela. The patient is tolerating medications without difficulties and is otherwise without complaint today.    Atrial Fibrillation Risk Factors:  he does not have symptoms or diagnosis of sleep apnea.  he does not have a history of rheumatic fever.  he does not have a history of alcohol use.  he has a BMI of Body mass index is 33.12 kg/m.Marland Kitchen Filed Weights   03/09/18 0832  Weight: 244 lb 3.2 oz (110.8 kg)    LA size: 47 mm   Atrial Fibrillation Management history:  Previous antiarrhythmic drugs: none  Previous cardioversions: none  Previous ablations: none  CHADS2VASC score: 1  Anticoagulation history: on xarelto since 02/03/18   Past Medical History:  Diagnosis Date  . Anxiety   .  Asthmatic bronchitis   . ED (erectile dysfunction)   . GERD (gastroesophageal reflux disease)   . History of colon polyps   . Hypercholesteremia   . Hypertension   . Obesity   . Persistent atrial fibrillation ([facility]) 02/02/2018  . Squamous cell carcinoma of skin   . SVT (supraventricular tachycardia) (HCC)    Past Surgical History:  Procedure Laterality Date  . CARDIOVERSION N/A 03/02/2018   Procedure: CARDIOVERSION;  Surgeon: Jerline Pain, MD;  Location[facility] ENDOSCOPY;  Service: Cardiovascular;  Laterality: N/A;  . left arm surgery    . skin cancer removed    . WISDOM TOOTH EXTRACTION      Current Outpatient Medications  Medication Sig Dispense Refill  . albuterol (PROVENTIL HFA;VENTOLIN HFA) 108 (90 Base) MCG/ACT inhaler Inhale into the lungs every 6 (six) hours as needed for wheezing or shortness of breath.    . budesonide (PULMICORT) 180 MCG/ACT inhaler Inhale 1-2 puffs into the lungs daily.     Marland Kitchen LORazepam (ATIVAN) 1 MG tablet Take 1 mg by mouth 2 (two) times daily as needed for anxiety. Seldom takes this    . nebivolol (BYSTOLIC) 2.5 MG tablet Take 2.5 mg by mouth daily.    . Probiotic Product (ALIGN) 4 MG CAPS Take 1 capsule by mouth daily.    . sildenafil (VIAGRA) 50 MG tablet Take 50 mg by mouth daily as needed for erectile dysfunction.    . vardenafil (LEVITRA) 20 MG tablet Take 20 mg by mouth daily as needed for erectile dysfunction.    Marland Kitchen  apixaban (ELIQUIS) 5 MG TABS tablet Take 1 tablet (5 mg total) by mouth 2 (two) times daily. 60 tablet 0   No current facility-administered medications for this encounter.     Allergies  Allergen Reactions  . Paroxetine     60 lb weight gain    Social History   Socioeconomic History  . Marital status: Married    Spouse name: Not on file  . Number of children: 4  . Years of education: Not on file  . Highest education level: Not on file  Occupational History  . Occupation: Secretary/administrator Professor  Social Needs  . Financial  resource strain: Not on file  . Food insecurity:    Worry: Not on file    Inability: Not on file  . Transportation needs:    Medical: Not on file    Non-medical: Not on file  Tobacco Use  . Smoking status: Never Smoker  . Smokeless tobacco: Never Used  Substance and Sexual Activity  . Alcohol use: Yes    Comment: 4-5 times weekly  . Drug use: No  . Sexual activity: Not on file  Lifestyle  . Physical activity:    Days per week: Not on file    Minutes per session: Not on file  . Stress: Not on file  Relationships  . Social connections:    Talks on phone: Not on file    Gets together: Not on file    Attends religious service: Not on file    Active member of club or organization: Not on file    Attends meetings of clubs or organizations: Not on file    Relationship status: Not on file  . Intimate partner violence:    Fear of current or ex partner: Not on file    Emotionally abused: Not on file    Physically abused: Not on file    Forced sexual activity: Not on file  Other Topics Concern  . Not on file  Social History Narrative   Lives in [place].  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  Attends [employer]    Family History  Problem Relation Age of Onset  . Emphysema Father   . Hypertension Father   . Emphysema Paternal Uncle   . Other Daughter        liver transplant  . Hypertension Mother   . Stroke Mother   . Cancer Brother   . Skin cancer Brother        basal cell  . Skin cancer Sister        basal cell    ROS- All systems are reviewed and negative except as per the HPI above.  Physical Exam: Vitals:   03/09/18 0832  BP: (!) 144/86  Pulse: 81  Weight: 244 lb 3.2 oz (110.8 kg)  Height: 6' (1.829 m)    GEN- The patient is well appearing, alert and oriented x 3 today.   Head- normocephalic, atraumatic Eyes-  Sclera clear, conjunctiva pink Ears- hearing intact Oropharynx- clear Neck- supple  Lungs- Clear to ausculation  bilaterally, normal work of breathing Heart- irregular rate and rhythm, no murmurs, rubs or gallops  GI- soft, NT, ND, + BS Extremities- no clubbing, cyanosis, or edema MS- no significant deformity or atrophy Skin- no rash or lesion Psych- euthymic mood, full affect Neuro- strength and sensation are intact  Wt Readings from Last 3 Encounters:  03/09/18 244 lb 3.2 oz (110.8 kg)  03/08/18 246 lb 3.2 oz (111.7 kg)  02/25/18 248 lb (112.5 kg)    EKG today demonstrates afib, V rate 81 bpm, QRS 80 msec, QTc 390  msec Echo 02/08/18 demonstrated EF is normal, LA 47 mm, no significant valvular disease  Epic records are reviewed at length today  Assessment and Plan:  1. Persistent atrial fibrillation Minimally asymptomatic afib Successful cardioversion but with ERAF after 48 hours Rate controlled Long discussion re antiarrythmic's Discussed in detail re multaq or flecainide  Continue xarelto for [facility] of 1 He will go ahead with his vacation plans and return 7/29 and will discuss getting antiarrythmic therapy started    2. HTN Stable No change required today  3. ? Side effects  of xarelto He is having some pains/ numbness of extremities since starting xaelto Neurologist suggested switching to Eliquis to see if any symptoms resolve with change in DOAC as he noticed many of his symptoms around the start of the drug Free 30 day card given and he will hold xarelto tonight and start eliquis 5 mg tonight and then continue 5 mg bid  Return to Af clinic 7/29  Roderic Palau, NP 03/09/2018 10:29 AM

## 2018-03-09 NOTE — Telephone Encounter (Signed)
This is a Dr. Jaynee Eagles patient.   Patient returned my call and he is scheduled for 03/16/18 at [facility]. He did inform me he is claustrophobic and he would like to take something to help with that but he wants to still be able to drive. Please advise.

## 2018-03-10 ENCOUNTER — Ambulatory Visit (INDEPENDENT_AMBULATORY_CARE_PROVIDER_SITE_OTHER): Payer: BC Managed Care – PPO | Admitting: Neurology

## 2018-03-10 ENCOUNTER — Encounter: Payer: Self-pay | Admitting: Neurology

## 2018-03-10 ENCOUNTER — Ambulatory Visit: Payer: BC Managed Care – PPO | Admitting: Neurology

## 2018-03-10 DIAGNOSIS — R202 Paresthesia of skin: Secondary | ICD-10-CM

## 2018-03-10 DIAGNOSIS — R531 Weakness: Secondary | ICD-10-CM

## 2018-03-10 DIAGNOSIS — R200 Anesthesia of skin: Secondary | ICD-10-CM

## 2018-03-10 DIAGNOSIS — R29898 Other symptoms and signs involving the musculoskeletal system: Secondary | ICD-10-CM

## 2018-03-10 DIAGNOSIS — R102 Pelvic and perineal pain: Secondary | ICD-10-CM

## 2018-03-10 NOTE — Progress Notes (Signed)
Please refer to EMG and nerve conduction study procedure note. 

## 2018-03-10 NOTE — Procedures (Signed)
HISTORY:  Calvin Collins is a 62 year old gentleman with a history of episodes of burning dysesthesias from the pelvis down into the legs, particularly below the knees, right greater than left.  He will have occasional lancinating pains in the arms and legs.  He is being evaluated for possible neuropathy or a lumbosacral radiculopathy.  NERVE CONDUCTION STUDIES:  Nerve conduction studies were performed on the right upper extremity.  The distal motor latency for the right median nerve was prolonged with a normal motor amplitude.  Distal motor latency and motor amplitudes for the right ulnar nerve were normal.  The nerve conduction velocities for the right median and ulnar nerves were normal.  The sensory latency for the right median nerve was slightly prolonged, normal for the right ulnar nerve.  The F-wave latency for the right ulnar nerve was normal.  Nerve conduction studies were performed on both lower extremities. The distal motor latencies and motor amplitudes for the peroneal and posterior tibial nerves were within normal limits. The nerve conduction velocities for these nerves were also normal. The sensory latencies for the peroneal and sural nerves were within normal limits. The F wave latencies for the posterior tibial nerves were within normal limits.   EMG STUDIES:  EMG study was performed on the right lower extremity:  The tibialis anterior muscle reveals 2 to 4K motor units with full recruitment. No fibrillations or positive waves were seen. The peroneus tertius muscle reveals 2 to 4K motor units with full recruitment. No fibrillations or positive waves were seen. The medial gastrocnemius muscle reveals 1 to 3K motor units with full recruitment. No fibrillations or positive waves were seen. The vastus lateralis muscle reveals 2 to 4K motor units with full recruitment. No fibrillations or positive waves were seen. The iliopsoas muscle reveals 2 to 4K motor units with full  recruitment. No fibrillations or positive waves were seen. The biceps femoris muscle (long head) reveals 2 to 4K motor units with full recruitment. No fibrillations or positive waves were seen. The lumbosacral paraspinal muscles were tested at 3 levels, and revealed no abnormalities of insertional activity at all 3 levels tested. There was good relaxation.  EMG study was performed on the left lower extremity:  The tibialis anterior muscle reveals 2 to 4K motor units with full recruitment. No fibrillations or positive waves were seen. The peroneus tertius muscle reveals 2 to 4K motor units with full recruitment. No fibrillations or positive waves were seen. The medial gastrocnemius muscle reveals 1 to 3K motor units with full recruitment. No fibrillations or positive waves were seen. The vastus lateralis muscle reveals 2 to 4K motor units with full recruitment. No fibrillations or positive waves were seen. The iliopsoas muscle reveals 2 to 4K motor units with full recruitment. No fibrillations or positive waves were seen. The biceps femoris muscle (long head) reveals 2 to 4K motor units with full recruitment. No fibrillations or positive waves were seen. The lumbosacral paraspinal muscles were tested at 3 levels, and revealed no abnormalities of insertional activity at all 3 levels tested. There was good relaxation.   IMPRESSION:  Nerve conduction studies done on the right upper extremity and both lower extremities does not show evidence of a peripheral neuropathy.  There is evidence of a mild right carpal tunnel syndrome.  EMG evaluation of both lower extremities were unremarkable, no evidence of a lumbosacral radiculopathy was seen.  Jill Alexanders MD 03/10/2018 4:42 PM  [facility] [address] [place]  [place], [place] [address]  Phone [phone] Fax [phone]

## 2018-03-10 NOTE — Progress Notes (Signed)
[place]    Nerve / Sites Muscle Latency Ref. Amplitude Ref. Rel Amp Segments Distance Velocity Ref. Area    ms ms mV mV %  cm m/s m/s mVms  R Median - APB     Wrist APB 4.4 ?4.4 4.9 ?4.0 100 Wrist - APB 7   15.5     Upper arm APB 8.9  4.5  92.6 Upper arm - Wrist 23 52 ?49 14.9  R Ulnar - ADM     Wrist ADM 2.8 ?3.3 7.4 ?6.0 100 Wrist - ADM 7   22.4     B.Elbow ADM 6.8  6.8  91.7 B.Elbow - Wrist 21 52 ?49 21.2     A.Elbow ADM 8.7  6.6  97.7 A.Elbow - B.Elbow 10 53 ?49 21.2         A.Elbow - Wrist      R Peroneal - EDB     Ankle EDB 5.1 ?6.5 3.2 ?2.0 100 Ankle - EDB 9   11.7     Fib head EDB 12.6  2.8  89.7 Fib head - Ankle 33 44 ?44 11.0     Pop fossa EDB 14.7  2.8  96.9 Pop fossa - Fib head 10 47 ?44 10.6         Pop fossa - Ankle      L Peroneal - EDB     Ankle EDB 5.9 ?6.5 2.3 ?2.0 100 Ankle - EDB 9   8.1     Fib head EDB 13.5  2.1  90.6 Fib head - Ankle 33 44 ?44 8.2     Pop fossa EDB 15.8  2.1  102 Pop fossa - Fib head 10 44 ?44 9.0         Pop fossa - Ankle      R Tibial - AH     Ankle AH 4.1 ?5.8 9.0 ?4.0 100 Ankle - AH 9   28.2     Pop fossa AH 13.4  7.4  82.3 Pop fossa - Ankle 40 43 ?41 23.1  L Tibial - AH     Ankle AH 4.6 ?5.8 13.3 ?4.0 100 Ankle - AH 9   39.0     Pop fossa AH 14.2  9.4  70.5 Pop fossa - Ankle 40 42 ?41 26.3                 [facility]    Nerve / Sites Rec. Site Peak Lat Ref.  Amp Ref. Segments Distance    ms ms V V  cm  R Sural - Ankle (Calf)     Calf Ankle 3.8 ?4.4 6 ?6 Calf - Ankle 14  L Sural - Ankle (Calf)     Calf Ankle 3.9 ?4.4 6 ?6 Calf - Ankle 14  R Superficial peroneal - Ankle     Lat leg Ankle 3.9 ?4.4 6 ?6 Lat leg - Ankle 14  L Superficial peroneal - Ankle     Lat leg Ankle 4.0 ?4.4 9 ?6 Lat leg - Ankle 14  R Median - Orthodromic (Dig II, Mid palm)     Dig II Wrist 3.8 ?3.4 8 ?10 Dig II - Wrist 13  R Ulnar - Orthodromic, (Dig V, Mid palm)     Dig V Wrist 2.9 ?3.1 8 ?5 Dig V - Wrist 69                  F  Wave    Nerve F Lat Ref.  ms ms  R  Tibial - AH 54.7 ?56.0  R Ulnar - ADM 30.2 ?32.0  L Tibial - AH 55.7 ?56.0           EMG full

## 2018-03-10 NOTE — Telephone Encounter (Signed)
FYI- Dr. Jannifer Franklin had a cancellation on 03-10-18 and agreed to work this pt in.

## 2018-03-12 ENCOUNTER — Encounter: Payer: Self-pay | Admitting: Neurology

## 2018-03-13 LAB — B12 AND FOLATE PANEL
FOLATE: 12.9 ng/mL (ref >3.0)
VITAMIN B 12: 390 pg/mL (ref 232–1245)

## 2018-03-13 LAB — MULTIPLE MYELOMA PANEL, SERUM
ALBUMIN/GLOB SERPL: 1.3 (ref 0.7–1.7)
ALPHA 1: 0.1 g/dL (ref 0.0–0.4)
Albumin SerPl Elph-Mcnc: 3.9 g/dL (ref 2.9–4.4)
Alpha2 Glob SerPl Elph-Mcnc: 0.7 g/dL (ref 0.4–1.0)
B-Globulin SerPl Elph-Mcnc: 1 g/dL (ref 0.7–1.3)
GAMMA GLOB SERPL ELPH-MCNC: 1.2 g/dL (ref 0.4–1.8)
GLOBULIN, TOTAL: 3.1 g/dL (ref 2.2–3.9)
IGA/IMMUNOGLOBULIN A, SERUM: 170 mg/dL (ref 90–386)
IgG (Immunoglobin G), Serum: 1224 mg/dL (ref 700–1600)
IgM (Immunoglobulin M), Srm: 103 mg/dL (ref 20–172)
Total Protein: 7 g/dL (ref 6.0–8.5)

## 2018-03-13 LAB — PAN-ANCA
ANCA Proteinase 3: 6.4 U/mL — ABNORMAL HIGH (ref 0.0–3.5)
Atypical pANCA: <1:20 {titer}
C-ANCA: <1:20 {titer}
Myeloperoxidase Ab: 12.4 U/mL — ABNORMAL HIGH (ref 0.0–9.0)
P-ANCA: <1:20 {titer}

## 2018-03-13 LAB — HEAVY METALS, BLOOD
Arsenic: 5 ug/L (ref 2–23)
LEAD, BLOOD: NOT DETECTED ug/dL (ref 0–4)
Mercury: 1.7 ug/L (ref 0.0–14.9)

## 2018-03-13 LAB — HIV ANTIBODY (ROUTINE TESTING W REFLEX): HIV SCREEN 4TH GENERATION: NONREACTIVE

## 2018-03-13 LAB — TSH: TSH: 1 u[IU]/mL (ref 0.450–4.500)

## 2018-03-13 LAB — RHEUMATOID FACTOR: Rhuematoid fact SerPl-aCnc: 22.1 IU/mL — ABNORMAL HIGH (ref 0.0–13.9)

## 2018-03-13 LAB — HEPATITIS C ANTIBODY

## 2018-03-13 LAB — SJOGREN'S SYNDROME ANTIBODS(SSA + SSB): ENA SSA (RO) Ab: <0.2 AI (ref 0.0–0.9)

## 2018-03-13 LAB — B. BURGDORFI ANTIBODIES

## 2018-03-13 LAB — ANA W/REFLEX: ANA: NEGATIVE

## 2018-03-13 LAB — C-REACTIVE PROTEIN: CRP: <1 mg/L (ref 0–10)

## 2018-03-13 LAB — VITAMIN B1: THIAMINE: 141.3 nmol/L (ref 66.5–200.0)

## 2018-03-13 LAB — VITAMIN B6: VITAMIN B6: 10.6 ug/L (ref 5.3–46.7)

## 2018-03-13 LAB — FERRITIN: FERRITIN: 308 ng/mL (ref 30–400)

## 2018-03-13 LAB — SEDIMENTATION RATE: Sed Rate: 5 mm/hr (ref 0–30)

## 2018-03-13 LAB — RPR: RPR: NONREACTIVE

## 2018-03-15 ENCOUNTER — Telehealth: Payer: Self-pay | Admitting: Neurology

## 2018-03-15 ENCOUNTER — Other Ambulatory Visit: Payer: Self-pay | Admitting: Neurology

## 2018-03-15 MED ORDER — ALPRAZOLAM 0.25 MG PO TABS: ORAL_TABLET | ORAL | 0 refills | Status: DC

## 2018-03-15 NOTE — Telephone Encounter (Signed)
Pt called stating he has an MRI scheduled for tomorrow, stating he has a driver for appt tomorrow and would like a medication to help calm pts nerves. Please send medication to [facility].

## 2018-03-15 NOTE — Telephone Encounter (Signed)
Called pt and informed him that Dr. Jaynee Eagles has ordered Xanax. Discussed instructions with pt as listed by MD. He verbalized understanding and appreciation.

## 2018-03-15 NOTE — Telephone Encounter (Signed)
Dr. Jaynee Eagles aware of pt's request and has ordered Xanax.

## 2018-03-15 NOTE — Progress Notes (Signed)
Emg/ncs was essentially normal, mild carpal tunnel in the hand. thanks

## 2018-03-16 ENCOUNTER — Ambulatory Visit (HOSPITAL_COMMUNITY): Payer: BC Managed Care – PPO | Admitting: Nurse Practitioner

## 2018-03-16 ENCOUNTER — Encounter: Payer: Self-pay | Admitting: Neurology

## 2018-03-16 ENCOUNTER — Other Ambulatory Visit: Payer: BC Managed Care – PPO

## 2018-03-16 ENCOUNTER — Ambulatory Visit: Payer: BC Managed Care – PPO

## 2018-03-16 DIAGNOSIS — R29898 Other symptoms and signs involving the musculoskeletal system: Secondary | ICD-10-CM

## 2018-03-16 DIAGNOSIS — G543 Thoracic root disorders, not elsewhere classified: Secondary | ICD-10-CM

## 2018-03-16 DIAGNOSIS — R202 Paresthesia of skin: Secondary | ICD-10-CM

## 2018-03-16 DIAGNOSIS — R531 Weakness: Secondary | ICD-10-CM | POA: Diagnosis not present

## 2018-03-16 DIAGNOSIS — R200 Anesthesia of skin: Secondary | ICD-10-CM

## 2018-03-16 DIAGNOSIS — R102 Pelvic and perineal pain: Secondary | ICD-10-CM

## 2018-03-16 DIAGNOSIS — M5104 Intervertebral disc disorders with myelopathy, thoracic region: Secondary | ICD-10-CM | POA: Diagnosis not present

## 2018-03-16 DIAGNOSIS — M546 Pain in thoracic spine: Secondary | ICD-10-CM | POA: Diagnosis not present

## 2018-03-19 ENCOUNTER — Telehealth: Payer: Self-pay | Admitting: Neurology

## 2018-03-19 NOTE — Telephone Encounter (Signed)
I have called the patient and spoke with him about the testing that was completed. I informed him that the lab work showed elevated rheumatologic markers and that Dr Jaynee Eagles would recommend the pt have a referral to Rheumatology for further work up. I reviewed the MRI thoracic spine results with the patient informing that this was normal showing minimal disc bulging. Informed him spinal cord intact and no nerve pinching seen. Pt has stated that he will hold off or the rhematology referral at the moment. He questions, in the visit it was discussed that if the thoracic didn't show anything a "step by step" approach could be completed. He wanted to know if Dr Jaynee Eagles still would like to proceed that way. I was mentioned in office note that if MRI thoracic was negative then a Cspine and brain would be completed. I advised the patient I would bring this to Dr Jaynee Eagles and see if she feels this is necessary. Pt verbalized understanding.

## 2018-03-19 NOTE — Telephone Encounter (Signed)
Calvin Collins:  Patient had minimally elevated rheumatologic markers but we do see slightly elevated markers often in people and doesn't mean he has rheumatologic disease. I would like to refer him to Rhaumatology for further workup (ANCA and RF slightly elevated needs evaluation for vasculitis or rheumatoid arthritis or other rheumatologic disorders that can be associated with his symptomof numbness and burning in the legss - rheumatologic disorders can cause these symptoms). MRI thoracic spine essentially normal, some very minimal disc bulging not causing any problems, no nerve pinching and the cord is normal thanks.  If he is agreeable please place rheumatology referral for ANCA positive and vasculitis evaluation as well as paresthesias thanks

## 2018-03-23 ENCOUNTER — Other Ambulatory Visit: Payer: Self-pay | Admitting: Neurology

## 2018-03-23 ENCOUNTER — Encounter: Payer: Self-pay | Admitting: Neurology

## 2018-03-23 DIAGNOSIS — R202 Paresthesia of skin: Secondary | ICD-10-CM

## 2018-03-23 DIAGNOSIS — R270 Ataxia, unspecified: Secondary | ICD-10-CM

## 2018-03-23 DIAGNOSIS — G3281 Cerebellar ataxia in diseases classified elsewhere: Secondary | ICD-10-CM

## 2018-03-23 DIAGNOSIS — R200 Anesthesia of skin: Secondary | ICD-10-CM

## 2018-03-23 DIAGNOSIS — R29898 Other symptoms and signs involving the musculoskeletal system: Secondary | ICD-10-CM

## 2018-03-23 DIAGNOSIS — R531 Weakness: Secondary | ICD-10-CM

## 2018-03-23 DIAGNOSIS — R102 Pelvic and perineal pain: Secondary | ICD-10-CM

## 2018-03-23 NOTE — Telephone Encounter (Signed)
I ordered the mri brain and cervical spine and emailed patient thanks

## 2018-03-24 ENCOUNTER — Ambulatory Visit (INDEPENDENT_AMBULATORY_CARE_PROVIDER_SITE_OTHER): Payer: BC Managed Care – PPO

## 2018-03-24 ENCOUNTER — Telehealth: Payer: Self-pay | Admitting: Neurology

## 2018-03-24 DIAGNOSIS — G3281 Cerebellar ataxia in diseases classified elsewhere: Secondary | ICD-10-CM

## 2018-03-24 DIAGNOSIS — R202 Paresthesia of skin: Secondary | ICD-10-CM | POA: Diagnosis not present

## 2018-03-24 DIAGNOSIS — R270 Ataxia, unspecified: Secondary | ICD-10-CM | POA: Diagnosis not present

## 2018-03-24 DIAGNOSIS — R200 Anesthesia of skin: Secondary | ICD-10-CM

## 2018-03-24 DIAGNOSIS — R29898 Other symptoms and signs involving the musculoskeletal system: Secondary | ICD-10-CM | POA: Diagnosis not present

## 2018-03-24 MED ORDER — GADOPENTETATE DIMEGLUMINE 469.01 MG/ML IV SOLN
20.0000 mL | Freq: Once | INTRAVENOUS | Status: AC | PRN
  Administered 2018-03-24: 20 mL via INTRAVENOUS

## 2018-03-24 NOTE — Telephone Encounter (Signed)
MR Brain w/wo contrast & MR Cervical spine wo contrast Dr. Ihor Dow Auth: [id number] (exp. 03/24/18 to 04/22/18). Patient is scheduled today 03/24/18 at [facility]. He is aware to arrive at 12:30 pm

## 2018-03-25 ENCOUNTER — Encounter: Payer: Self-pay | Admitting: *Deleted

## 2018-03-25 ENCOUNTER — Telehealth: Payer: Self-pay | Admitting: *Deleted

## 2018-03-25 NOTE — Telephone Encounter (Signed)
Emailed pt through his mychart account.

## 2018-03-25 NOTE — Telephone Encounter (Signed)
-----   Message from Melvenia Beam, MD sent at 03/15/2018  5:11 PM EDT -----   ----- Message ----- From: Kathrynn Ducking, MD Sent: 03/10/2018   4:47 PM To: Melvenia Beam, MD

## 2018-03-25 NOTE — Progress Notes (Signed)
Office Visit Note  Patient: Calvin Collins             Date of Birth: 1956-09-01           MRN: [id number]             PCP: Harlan Stains, MD Referring: Darleen Crocker, RN Visit Date: 04/08/2018 Occupation: Faculty at [employer]  Subjective:  Positive rheumatoid factor   History of Present Illness: Calvin Collins is a 62 y.o. male in consultation per request of his PCP.  According to patient his symptoms are started in summer 2018 with right lower extremity numbness below his knee.  He states he was having leg and burning sensation in the perineum and bilateral lower extremities more prominent in his right lower extremity.  He was also having abdominal pain.  He states he had lab work, MRI of his pelvis and CT abdomen which were all within normal limits.  He said the symptoms lasted for 30 days and then dissipated.  He states he has been through very difficult time as his daughter who had liver transplant in the past developed a stage IV lymphoma who is in remission now.  During that time he was under a lot of stress.  He states he did quite well during the winter time and was running and swimming.  In April 2019 his daughter got sick again and his symptoms recurred.  He states the paresthesias recurred and he started having abdominal and back pain.  He had ultrasound and the CT scan of his abdomen which was all negative.  He was also seen by neurologist and had MRI of his brain, C-spine, thoracic and lumbar spine.  He said except for some degenerative changes there was nothing remarkable.  He underwent nerve conduction velocity and EMG which was unremarkable per patient.  He also had extensive labs done by his neurologist which came positive for rheumatoid factor.  He states the right lower extremities paresthesias persist.  He has sudden sharp pain which lasts for few minutes and then goes away in different parts of his body.  He has been experiencing pain over bilateral medial epicondyle area,  bilateral shoulders, bilateral hands and feet which come and go.  He has not seen any visible swelling.  He also has right second and fourth trigger finger.  He has some intermittent discomfort in his C-spine.  He denies any joint swelling.  Activities of Daily Living:  Patient reports morning stiffness for 10 minutes.   Patient Reports nocturnal pain.  Difficulty dressing/grooming: Denies Difficulty climbing stairs: Denies Difficulty getting out of chair: Denies Difficulty using hands for taps, buttons, cutlery, and/or writing: Denies  Review of Systems  Constitutional: Positive for activity change. Negative for fatigue and night sweats.  HENT: Negative for mouth sores, trouble swallowing, trouble swallowing, mouth dryness and nose dryness.   Eyes: Positive for dryness. Negative for redness.  Respiratory: Positive for cough and wheezing. Negative for shortness of breath and difficulty breathing.   Cardiovascular: Positive for irregular heartbeat. Negative for chest pain, palpitations, hypertension and swelling in legs/feet.  Gastrointestinal: Negative for constipation and diarrhea.  Endocrine: Positive for cold intolerance. Negative for increased urination.  Genitourinary: Negative for difficulty urinating.  Musculoskeletal: Positive for arthralgias, joint pain, muscle weakness, morning stiffness and muscle tenderness. Negative for joint swelling, myalgias and myalgias.  Skin: Negative for color change, rash, hair loss, nodules/bumps, skin tightness, ulcers and sensitivity to sunlight.  Allergic/Immunologic: Negative for susceptible to infections.  Neurological:  Positive for light-headedness and numbness. Negative for dizziness, fainting, memory loss, night sweats and weakness ( ).  Hematological: Positive for bruising/bleeding tendency. Negative for swollen glands.  Psychiatric/Behavioral: Positive for sleep disturbance. Negative for depressed mood. The patient is nervous/anxious.      PMFS History:  Patient Active Problem List   Diagnosis Date Noted  . Paresthesias 03/08/2018  . Atypical atrial flutter ([place])   . Asthma with allergic rhinitis 10/17/2011  . Paroxysmal atrial fibrillation ([facility])   . Hypertension   . Obesity   . ED (erectile dysfunction)   . Anxiety     Past Medical History:  Diagnosis Date  . Anxiety   . Asthmatic bronchitis   . ED (erectile dysfunction)   . GERD (gastroesophageal reflux disease)   . History of colon polyps   . Hypercholesteremia   . Hypertension   . Obesity   . Persistent atrial fibrillation ([place]) 02/02/2018  . Squamous cell carcinoma of skin   . SVT (supraventricular tachycardia) (H[facility])     Family History  Problem Relation Age of Onset  . Emphysema Father   . Hypertension Father   . Emphysema Paternal Uncle   . Other Daughter        liver transplant  . Hypertension Mother   . Stroke Mother   . Cancer Brother   . Skin cancer Brother        basal cell  . Skin cancer Sister        basal cell   Past Surgical History:  Procedure Laterality Date  . CARDIOVERSION N/A 03/02/2018   Procedure: CARDIOVERSION;  Surgeon: Jerline Pain, MD;  Location: [facility] ENDOSCOPY;  Service: Cardiovascular;  Laterality: N/A;  . left arm surgery    . skin cancer removed    . WISDOM TOOTH EXTRACTION     Social History   Social History Narrative   Lives in [place].  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  Attends [employer]    Objective: Vital Signs: BP 132/83 (BP Location: Left Arm, Patient Position: Sitting, Cuff Size: Normal)   Pulse 89   Resp 16   Ht 6' (1.829 m)   Wt 242 lb (109.8 kg)   BMI 32.82 kg/m    Physical Exam  Constitutional: He is oriented to person, place, and time. He appears well-developed and well-nourished.  HENT:  Head: Normocephalic and atraumatic.  Eyes: Pupils are equal, round, and reactive to light. Conjunctivae and EOM are normal.  Neck: Normal range of motion. Neck supple.   Cardiovascular: Normal rate, regular rhythm and normal heart sounds.  Pulmonary/Chest: Effort normal and breath sounds normal.  Abdominal: Soft. Bowel sounds are normal.  Neurological: He is alert and oriented to person, place, and time.  Skin: Skin is warm and dry. Capillary refill takes less than 2 seconds.  Psychiatric: He has a normal mood and affect. His behavior is normal.  Nursing note and vitals reviewed.    Musculoskeletal Exam: C-spine thoracic lumbar spine good range of motion.  No SI joint tenderness was noted.  Shoulder joints elbow joints were in good range of motion.  He has tenderness on palpation of bilateral medial epicondyle.  Wrist joints MCPs PIPs were in good range of motion with no synovitis.  He has DIP and PIP thickening in his hands.  He also had flexor tendon thickening of the right second and fourth flexor tendons.  Hip joints knee joints ankles MTPs PIPs were in good range of motion with no  synovitis.  He has some discomfort range of motion of his left hip joint.  PIP and DIP thickening in his feet was noted.  CDAI Exam: No CDAI exam completed.   Investigation: Findings:  03/08/2018: Thiamine 141.3, Lyme antibody negative, vitamin B6 10.6, RF 22.1, TSH 1.0, HIV negative, RPR negative, hep C negative, immunoglobulins within normal limits, SPEP normal, methyl peroxidase antibody 12.4, ANCA 6.4, C-ANCA <1:20, P-ANCA <1:20, atypical PANCA 1:20, lead not detected, arsenic 5, mercury 1.7, Ro <0.2, La <0.2, vitamin B12 390, folate 12.9, CRP less than 1, sed rate 5, ferritin 308  Component     Latest Ref Rng & Units 03/08/2018  IgG (Immunoglobin G), Serum     700 - 1,600 mg/dL 1,224  IgA/Immunoglobulin A, Serum     90 - 386 mg/dL 170  IgM (Immunoglobulin M), Srm     20 - 172 mg/dL 103  Total Protein     6.0 - 8.5 g/dL 7.0  Albumin SerPl Elph-Mcnc     2.9 - 4.4 g/dL 3.9  Alpha 1     0.0 - 0.4 g/dL 0.1  Alpha2 Glob SerPl Elph-Mcnc     0.4 - 1.0 g/dL 0.7  B-Globulin  SerPl Elph-Mcnc     0.7 - 1.3 g/dL 1.0  Gamma Glob SerPl Elph-Mcnc     0.4 - 1.8 g/dL 1.2  M Protein SerPl Elph-Mcnc     Not Observed g/dL Not Observed  Globulin, Total     2.2 - 3.9 g/dL 3.1  Albumin/Glob SerPl     0.7 - 1.7 1.3  IFE 1      Comment  Please Note (HCV):      Comment  Myeloperoxidase Ab     0.0 - 9.0 U/mL 12.4 (H)  ANCA Proteinase 3     0.0 - 3.5 U/mL 6.4 (H)  Cytoplasmic (C-ANCA)     Neg:<1:20 titer <1:20  P-ANCA     Neg:<1:20 titer <1:20  Atypical pANCA     Neg:<1:20 titer <1:20  Lead, Blood     0 - 4 ug/dL None Detected  Arsenic, Blood     2 - 23 ug/L 5  Mercury     0.0 - 14.9 ug/L 1.7  ENA SSA (RO) Ab     0.0 - 0.9 AI <0.2  ENA SSB (LA) Ab     0.0 - 0.9 AI <0.2  Vitamin B12     232 - 1,245 pg/mL 390  Folate     >3.0 ng/mL 12.9  CRP     0 - 10 mg/L <1  Sed Rate     0 - 30 mm/hr 5  Ferritin     30 - 400 ng/mL 308  TSH     0.450 - 4.500 uIU/mL 1.000  HIV Screen 4th Generation wRfx     Non Reactive Non Reactive  Anti Nuclear Antibody(ANA)     Negative Negative  RPR     Non Reactive Non Reactive  Hep C Virus Ab     0.0 - 0.9 s/co ratio <0.1  RA Latex Turbid.     0.0 - 13.9 IU/mL 22.1 (H)  Vitamin B6     5.3 - 46.7 ug/L 10.6  Lyme IgG/IgM Ab     0.00 - 0.90 ISR <0.91  Thiamine     66.5 - 200.0 nmol/L 141.3   Imaging: Mr Kizzie Fantasia Contrast  Result Date: 03/26/2018  [facility] [address] [place], [place] [address] [phone] NEUROIMAGING  REPORT STUDY DATE: 03/24/2018 PATIENT NAME: Calvin Collins DOB: 12-Apr-1956 MRN: [id number] ORDERING CLINICIAN: Dr Krista Blue CLINICAL HISTORY:  62 year patient with ataxia COMPARISON FILMS: none EXAM: MRI Brain w/wo TECHNIQUE: MRI of the brain with and without contrast was obtained utilizing 5 mm axial slices with T1, T2, T2 flair, T2 star gradient echo and diffusion weighted views.  T1 sagittal, T2 coronal and postcontrast views in the axial and coronal plane were obtained. CONTRAST:  20 ml iv magnevist IMAGING SITE: [facility] [address] FINDINGS: The brain parenchyma shows mild age-appropriate changes of chronic microvascular ischemia and generalized cerebral atrophy. No structural lesion, tumor infarcts are noted. The paranasal sinuses show mild chronic inflammatory changes.No abnormal lesions are seen on diffusion-weighted views to suggest acute ischemia. The cortical sulci, fissures and cisterns are normal in size and appearance. Lateral, third and fourth ventricle are normal in size and appearance. No extra-axial fluid collections are seen. No evidence of mass effect or midline shift.  No abnormal lesions are seen on post contrast views.  On sagittal views the posterior fossa, pituitary gland and corpus callosum are unremarkable. No evidence of intracranial hemorrhage on gradient-echo views. The orbits and their contents, paranasal sinuses and calvarium are unremarkable.  Intracranial flow voids are present.    Unremarkable MRI scan of the brain with and without contrast INTERPRETING PHYSICIAN: PRAMOD SETHI, MD Certified in  Neuroimaging by [place] [facility] and [employer] for [facility]rological Subspecialities   Mr Cervical Spine Wo Contrast  Result Date: 03/26/2018  [facility] [address] [place], [place] [address] [phone] NEUROIMAGING REPORT STUDY DATE: 03/24/2018 PATIENT NAME: Calvin Collins DOB: 07-04-1956 MRN: [id number] ORDERING CLINICIAN: Dr Krista Blue CLINICAL HISTORY:  62 year patient with neck pain and ataxia COMPARISON FILMS: None EXAM: MRI cervical spine without TECHNIQUE: MRI of the cervical spine was obtained utilizing 3 mm sagittal slices from the posterior fossa down to the T3-4 level with T1, T2 and inversion recovery views. In addition 4 mm axial slices from O7-5 down to T1-2 level were included with T2 and gradient echo views. CONTRAST: None IMAGING SITE: [facility] [address] FINDINGS: The cervical vertebrae  demonstrate abnormal alignment with loss of forward lordotic curvature and mild posterior subluxation of C5 over C6 vertebrae. C2-3 and C3-4 showed no significant abnormalities except minor disc signal change. C4-5 shows minor disc bulge without significant compression. C5-6 shows broad based disc osteophyte protrusion resulting in mild canal and moderate bilateral foraminal narrowing. C6-7 shows asymmetric disc osteophyte protrusion to the left resulting in mild foraminal narrowing on the left. The spinal cord parenchyma shows no abnormal signal characteristics. Visualized portion of the lower brainstem, cranial vertebral junction and upper thoracic spine been remarkable.The paraspinal soft tissues show no abnormalities.    Abnormal MRI scan of the cycle spine showing spondylitic change at C5-6 resulting in moderate bilateral foraminal narrowing and here is mild left-sided foraminal narrowing at C6-7 as well. INTERPRETING PHYSICIAN: Antony Contras, MD Certified in  Neuroimaging by [place] [facility] and [employer] for Neurological Subspecialities   Mr Thoracic Spine Wo Contrast  Result Date: 03/18/2018 [facility] REPORT STUDY DATE: 03/16/18 PATIENT NAME: Calvin Collins DOB: 1955/12/14 MRN: [id number] ORDERING CLINICIAN: Sarina Ill, MD CLINICAL HISTORY: 62 year old male with thoracic pain. EXAM: MRI thoracic spine (without) TECHNIQUE: MRI of the thoracic spine was obtained utilizing 3 mm sagittal slices from A4-1 down to the L1-2 level with T1, T2 and inversion recovery views.  In addition 4 mm axial slices from T0-G2 down to T12-L1 level were included with T1, T2 and gradient echo views.  CONTRAST: no COMPARISON: none IMAGING SITE: [facility] [address] (1.5 Tesla MRI) FINDINGS: On sagittal views the vertebral bodies have normal height and alignment. Hemangiomas at T7, T9, T10 and T11. Minimal disc bulging from T2-3 to T10-11 levels.  Mild  degenerative endplate disease with marrow edema anteriorly at T7-8. The spinal cord is normal in size and appearance. The paraspinal soft tissues are unremarkable.  On axial views there is no spinal stenosis or foraminal narrowing.  Limited views of the aorta, kidneys, liver, lungs and paraspinal muscles are unremarkable.   Minimal disc bulging from T2-3 to T10-11 levels. No spinal stenosis or foraminal narrowing. No spinal cord lesions. INTERPRETING PHYSICIAN: Penni Bombard, MD Certified in Neurology, Neurophysiology and Neuroimaging [facility] [address], [place], [place] [address] [phone]   Xr Hip Unilat W Or W/o Pelvis 2-3 Views Left  Result Date: 04/08/2018 No significant hip joint narrowing was noted. sclerosis of the acetabulum was noted.  Small spur was noted.  Cystic changes were noted in the femoral head.  No SI joint changes were noted. Impression: These findings are consistent with mild osteoarthritis of the hip joint.  Xr Foot 2 Views Left  Result Date: 04/08/2018 First MTP, all PIP and DIP joint space narrowing was noted.  No erosive changes were noted.  No intertarsal joint space narrowing was noted.  No tibiotalar or subtalar joint space narrowing was noted.  A posterior calcaneal spur was noted. Impression: These findings are consistent with osteoarthritis of the foot.  Xr Foot 2 Views Right  Result Date: 04/08/2018 First MTP, all PIP and DIP joint space narrowing was noted.  No erosive changes were noted.  No intertarsal joint space narrowing was noted.  No tibiotalar or subtalar joint space narrowing was noted.  A posterior calcaneal spur was noted. Impression: These findings are consistent with osteoarthritis of the foot.  Xr Hand 2 View Left  Result Date: 04/08/2018 PIP and DIP narrowing was noted.  CMC narrowing was noted.  No MCP intercarpal radiocarpal joint space narrowing was noted.  No erosive changes were noted.  A plate was noted in  the left radius. Impression: These findings are consistent with osteoarthritis of the hand.  Xr Hand 2 View Right  Result Date: 04/08/2018 PIP and DIP narrowing was noted.  No MCP intercarpal radiocarpal joint space narrowing was noted.  No erosive changes were noted. Impression: These findings are consistent with osteoarthritis of the hand.   Recent Labs: Lab Results  Component Value Date   WBC 5.6 02/25/2018   HGB 15.9 02/25/2018   PLT 228 02/25/2018   NA 140 02/25/2018   K 4.4 02/25/2018   CL 111 02/25/2018   CO2 23 02/25/2018   GLUCOSE 104 (H) 02/25/2018   BUN 9 02/25/2018   CREATININE 1.02 02/25/2018   PROT 7.0 03/08/2018   CALCIUM 9.6 02/25/2018   GFRAA >60 02/25/2018    Speciality Comments: No specialty comments available.  Procedures:  No procedures performed Allergies: Paroxetine   Assessment / Plan:     Visit Diagnoses: Rheumatoid factor positive -patient was referred to me because of positive rheumatoid factor.  He has some arthralgias which appear to be related to osteoarthritis and bilateral medial epicondylitis.  I do not see any synovitis on examination.  03/08/18: RF 22.1Ro-, La-, CRP <1, sed rate 5 all other  labs are under findings - Plan: Cyclic citrul peptide antibody, IgG, 14-3-3 eta Protein  Paresthesias-persist although all his neurology work-up was negative.  Pain in both hands -the clinical findings are consistent with osteoarthritis.  Plan: XR Hand 2 View Right, XR Hand 2 View Left.  The x-rays were consistent with osteoarthritis of the hand.  Trigger finger, right index and ring fingers-we discussed possible cortisone injection in future if approved by his cardiologist.  Pain in left hip -he has intermittent discomfort in his left hip especially with range of motion.  Plan: XR HIP UNILAT W OR W/O PELVIS 2-3 VIEWS LEFT.  The x-ray showed mild osteoarthritis of the hip joint.  Weight loss diet exercise and muscle strengthening exercises were  discussed.  Pain in both feet -he complains of discomfort in his feet off and on.  No synovitis was noted.  Clinical findings were consistent with osteoarthritis and high arch feet.  Plan: XR Foot 2 Views Right, XR Foot 2 Views Left.  The x-rays were consistent with osteoarthritis of the feet.  Pain in pelvis-extensive work-up in the past has been negative.  Paroxysmal atrial fibrillation ([facility]CC)-he was still in atrial fibrillation.  Essential hypertension-his blood pressure is controlled.  History of squamous cell carcinoma and basal cell carcinoma.  History of colonic polyps  History of anxiety-he has been under a lot of stress.  Hypercholesteremia  History of asthma   Orders: Orders Placed This Encounter  Procedures  . XR Hand 2 View Right  . XR Hand 2 View Left  . XR HIP UNILAT W OR W/O PELVIS 2-3 VIEWS LEFT  . XR Foot 2 Views Right  . XR Foot 2 Views Left  . Cyclic citrul peptide antibody, IgG  . 14-3-3 eta Protein   No orders of the defined types were placed in this encounter.   Face-to-face time spent with patient was 60 minutes. Greater than 50% of time was spent in counseling and coordination of care.  Follow-Up Instructions: Return for Positive RF, osteoarthritis.   Bo Merino, MD  Note - This record has been created using Editor, commissioning.  Chart creation errors have been sought, but may not always  have been located. Such creation errors do not reflect on  the standard of medical care.

## 2018-03-29 ENCOUNTER — Telehealth: Payer: Self-pay | Admitting: *Deleted

## 2018-03-29 ENCOUNTER — Encounter: Payer: Self-pay | Admitting: *Deleted

## 2018-03-29 NOTE — Telephone Encounter (Addendum)
Called pt and Calvin Collins (ok per DPR) informing him of the MRI brain & MRI cervical spine result comments in detail from Dr. Jaynee Eagles. Left office number in message and encouraged pt to email or call with any questions he may have. Also emailed pt the result comments in mychart.   ----- Message from Melvenia Beam, MD sent at 03/27/2018 10:37 AM EDT ----- Patient has some arthritic changes in the neck and possibly some pinching of nerves that come out of the spinal cord that may or may not  affect his arms but absolutely no findings that would explain his lower extremity symptoms and his cervical spinal cord is very healthy and normal.  Notes recorded by Melvenia Beam, MD on 03/27/2018 at 10:33 AM EDT MRI brain is normal for age thanks

## 2018-04-05 ENCOUNTER — Ambulatory Visit (HOSPITAL_COMMUNITY)
Admission: RE | Admit: 2018-04-05 | Discharge: 2018-04-05 | Disposition: A | Discharge status: Home / Self Care | Payer: BC Managed Care – PPO | Source: Ambulatory Visit | Attending: Nurse Practitioner | Admitting: Nurse Practitioner

## 2018-04-05 ENCOUNTER — Encounter (HOSPITAL_COMMUNITY): Payer: Self-pay | Admitting: Nurse Practitioner

## 2018-04-05 VITALS — BP 124/86 | HR 96 | Ht 72.0 in | Wt 240.0 lb

## 2018-04-05 DIAGNOSIS — I4891 Unspecified atrial fibrillation: Secondary | ICD-10-CM | POA: Diagnosis present

## 2018-04-05 DIAGNOSIS — E669 Obesity, unspecified: Secondary | ICD-10-CM | POA: Diagnosis not present

## 2018-04-05 DIAGNOSIS — Z7951 Long term (current) use of inhaled steroids: Secondary | ICD-10-CM | POA: Diagnosis not present

## 2018-04-05 DIAGNOSIS — Z808 Family history of malignant neoplasm of other organs or systems: Secondary | ICD-10-CM | POA: Diagnosis not present

## 2018-04-05 DIAGNOSIS — I10 Essential (primary) hypertension: Secondary | ICD-10-CM | POA: Diagnosis not present

## 2018-04-05 DIAGNOSIS — I471 Supraventricular tachycardia: Secondary | ICD-10-CM | POA: Diagnosis not present

## 2018-04-05 DIAGNOSIS — E7800 Pure hypercholesterolemia, unspecified: Secondary | ICD-10-CM | POA: Insufficient documentation

## 2018-04-05 DIAGNOSIS — Z79899 Other long term (current) drug therapy: Secondary | ICD-10-CM | POA: Diagnosis not present

## 2018-04-05 DIAGNOSIS — Z823 Family history of stroke: Secondary | ICD-10-CM | POA: Insufficient documentation

## 2018-04-05 DIAGNOSIS — K219 Gastro-esophageal reflux disease without esophagitis: Secondary | ICD-10-CM | POA: Diagnosis not present

## 2018-04-05 DIAGNOSIS — R9431 Abnormal electrocardiogram [ECG] [EKG]: Secondary | ICD-10-CM | POA: Insufficient documentation

## 2018-04-05 DIAGNOSIS — Z8249 Family history of ischemic heart disease and other diseases of the circulatory system: Secondary | ICD-10-CM | POA: Insufficient documentation

## 2018-04-05 DIAGNOSIS — Z86010 Personal history of colonic polyps: Secondary | ICD-10-CM | POA: Diagnosis not present

## 2018-04-05 DIAGNOSIS — I481 Persistent atrial fibrillation: Secondary | ICD-10-CM

## 2018-04-05 DIAGNOSIS — F419 Anxiety disorder, unspecified: Secondary | ICD-10-CM | POA: Diagnosis not present

## 2018-04-05 DIAGNOSIS — Z85828 Personal history of other malignant neoplasm of skin: Secondary | ICD-10-CM | POA: Diagnosis not present

## 2018-04-05 DIAGNOSIS — Z7901 Long term (current) use of anticoagulants: Secondary | ICD-10-CM | POA: Insufficient documentation

## 2018-04-05 DIAGNOSIS — I4819 Other persistent atrial fibrillation: Secondary | ICD-10-CM

## 2018-04-05 DIAGNOSIS — J45909 Unspecified asthma, uncomplicated: Secondary | ICD-10-CM | POA: Insufficient documentation

## 2018-04-05 MED ORDER — APIXABAN 5 MG PO TABS: 5.0000 mg | ORAL_TABLET | Freq: Two times a day (BID) | ORAL | 6 refills | Status: DC

## 2018-04-05 NOTE — Progress Notes (Signed)
Primary Care Physician: Harlan Stains, MD Primary Electrophysiologist: none  Calvin Collins is a 62 y.o. male with a history of persistent atrial fibrillation since the first of June, who presents for follow up in the [facility].  Since last being seen in clinic, the patient reports doing reasonably well.  He remains in afib.  + palpitaitons.  He went to the beach on vacation with the family and was able to play/swim in the ocean, haul heavy beach chairs without any symptoms of chest pain, shortness of breath or exertional dyspnea. He is aware of feeling an irregular rhythm when he tries to go to sleep.      He had a scheduled cardioversion 6/25, which unfortunately only lasted 48 hours. He is not symptomatic with afib and only knows as he tracks his HR. He is here to discuss options as to what is next.He was seen recently by Neurology for some paresthesias mostly affecting lower legs. He feels these symptoms started about the same time of the xarelto and was switched to eliquis to see if symptoms will improve, however they did not improve, otherwise looking at more extensive neurologic w/u. Marland Kitchen He is interested in antiarrythmic therapy but would like to wait until he returns from vacation.  Today, following his vacation, he is still undecided whether to pursue antiarrythmic therapy as he does not feel symptomatic. We discussed repeating an echo 3 months into afib and if EF holds in normal range, he may forgo antiarrythmic therapy.  Today, he  denies symptoms of palpitations, chest pain, shortness of breath, orthopnea, PND, lower extremity edema, dizziness, presyncope, syncope, snoring, daytime somnolence, bleeding, or neurologic sequela. The patient is tolerating medications without difficulties and is otherwise without complaint today.    Atrial Fibrillation Risk Factors:  he does not have symptoms or diagnosis of sleep apnea.  he does not have a history of rheumatic  fever.  he does not have a history of alcohol use.  he has a BMI of Body mass index is 32.55 kg/m.Marland Kitchen Filed Weights   04/05/18 0841  Weight: 240 lb (108.9 kg)    LA size: 47 mm   Atrial Fibrillation Management history:  Previous antiarrhythmic drugs: none  Previous cardioversions:  one  Previous ablations: none  CHADS2VASC score: 1  Anticoagulation history: on xarelto since 02/03/18   Past Medical History:  Diagnosis Date  . Anxiety   . Asthmatic bronchitis   . ED (erectile dysfunction)   . GERD (gastroesophageal reflux disease)   . History of colon polyps   . Hypercholesteremia   . Hypertension   . Obesity   . Persistent atrial fibrillation ([place]) 02/02/2018  . Squamous cell carcinoma of skin   . SVT (supraventricular tachycardia) (HCC)    Past Surgical History:  Procedure Laterality Date  . CARDIOVERSION N/A 03/02/2018   Procedure: CARDIOVERSION;  Surgeon: Jerline Pain, MD;  Location: [facility] ENDOSCOPY;  Service: Cardiovascular;  Laterality: N/A;  . left arm surgery    . skin cancer removed    . WISDOM TOOTH EXTRACTION      Current Outpatient Medications  Medication Sig Dispense Refill  . albuterol (PROVENTIL HFA;VENTOLIN HFA) 108 (90 Base) MCG/ACT inhaler Inhale into the lungs every 6 (six) hours as needed for wheezing or shortness of breath.    . ALPRAZolam (XANAX) 0.25 MG tablet Take 1-2 tabs (0.25mg -0.50mg ) 30-60 minutes before procedure. May repeat if needed.Do not drive. 4 tablet 0  . apixaban (ELIQUIS) 5 MG TABS  tablet Take 1 tablet (5 mg total) by mouth 2 (two) times daily. 60 tablet 6  . budesonide (PULMICORT) 180 MCG/ACT inhaler Inhale 1-2 puffs into the lungs daily.     Marland Kitchen LORazepam (ATIVAN) 1 MG tablet Take 1 mg by mouth 2 (two) times daily as needed for anxiety. Seldom takes this    . nebivolol (BYSTOLIC) 2.5 MG tablet Take 2.5 mg by mouth daily.    . Probiotic Product (ALIGN) 4 MG CAPS Take 1 capsule by mouth daily.    . sildenafil (VIAGRA) 50 MG  tablet Take 50 mg by mouth daily as needed for erectile dysfunction.    . vardenafil (LEVITRA) 20 MG tablet Take 20 mg by mouth daily as needed for erectile dysfunction.     No current facility-administered medications for this encounter.     Allergies  Allergen Reactions  . Paroxetine     60 lb weight gain    Social History   Socioeconomic History  . Marital status: Married    Spouse name: Not on file  . Number of children: 4  . Years of education: Not on file  . Highest education level: Not on file  Occupational History  . Occupation: Secretary/administrator Professor  Social Needs  . Financial resource strain: Not on file  . Food insecurity:    Worry: Not on file    Inability: Not on file  . Transportation needs:    Medical: Not on file    Non-medical: Not on file  Tobacco Use  . Smoking status: Never Smoker  . Smokeless tobacco: Never Used  Substance and Sexual Activity  . Alcohol use: Yes    Comment: 4-5 times weekly  . Drug use: No  . Sexual activity: Not on file  Lifestyle  . Physical activity:    Days per week: Not on file    Minutes per session: Not on file  . Stress: Not on file  Relationships  . Social connections:    Talks on phone: Not on file    Gets together: Not on file    Attends religious service: Not on file    Active member of club or organization: Not on file    Attends meetings of clubs or organizations: Not on file    Relationship status: Not on file  . Intimate partner violence:    Fear of current or ex partner: Not on file    Emotionally abused: Not on file    Physically abused: Not on file    Forced sexual activity: Not on file  Other Topics Concern  . Not on file  Social History Narrative   Lives in [place].  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  Attends [employer]    Family History  Problem Relation Age of Onset  . Emphysema Father   . Hypertension Father   . Emphysema Paternal Uncle   . Other Daughter        liver  transplant  . Hypertension Mother   . Stroke Mother   . Cancer Brother   . Skin cancer Brother        basal cell  . Skin cancer Sister        basal cell    ROS- All systems are reviewed and negative except as per the HPI above.  Physical Exam: Vitals:   04/05/18 0841  BP: 124/86  Pulse: 96  Weight: 240 lb (108.9 kg)  Height: 6' (1.829 m)    GEN- The patient  is well appearing, alert and oriented x 3 today.   Head- normocephalic, atraumatic Eyes-  Sclera clear, conjunctiva pink Ears- hearing intact Oropharynx- clear Neck- supple  Lungs- Clear to ausculation bilaterally, normal work of breathing Heart- irregular rate and rhythm, no murmurs, rubs or gallops  GI- soft, NT, ND, + BS Extremities- no clubbing, cyanosis, or edema MS- no significant deformity or atrophy Skin- no rash or lesion Psych- euthymic mood, full affect Neuro- strength and sensation are intact  Wt Readings from Last 3 Encounters:  04/05/18 240 lb (108.9 kg)  03/09/18 244 lb 3.2 oz (110.8 kg)  03/08/18 246 lb 3.2 oz (111.7 kg)    EKG today demonstrates afib, V rate 96 bpm, QRS 80 msec, QTc 401  msec Echo 02/08/18 demonstrated EF is normal, LA 47 mm, no significant valvular disease, Mod LVH  Epic records are reviewed at length today  Assessment and Plan:  1. Persistent atrial fibrillation Minimally asymptomatic afib Successful cardioversion but with ERAF after 48 hours Rate controlled Long discussion re antiarrythmic's Discussed in detail re multaq or flecainide  Continue xarelto for [facility] of 1 He would like to wait on antiarrythmic drugs, repeat echo 3 months into persistent afib and if EF has shown no decline, he may choose to live in afib  2. HTN Stable No change required today  3. ? Side effects  of xarelto He is having some pains/ numbness of extremities since starting xaelto Neurologist suggested switching to Eliquis to see if any symptoms resolve with change in DOAC as he noticed  many of his symptoms around the start of the drug He made the switch but no improvement in symptoms   Echo scheduled for early September and further decisions will be made at that time base on results and pt symptoms  Roderic Palau, NP 04/05/2018 10:18 AM

## 2018-04-08 ENCOUNTER — Ambulatory Visit (INDEPENDENT_AMBULATORY_CARE_PROVIDER_SITE_OTHER): Payer: Self-pay

## 2018-04-08 ENCOUNTER — Encounter: Payer: Self-pay | Admitting: Rheumatology

## 2018-04-08 ENCOUNTER — Ambulatory Visit: Payer: BC Managed Care – PPO | Admitting: Rheumatology

## 2018-04-08 VITALS — BP 132/83 | HR 89 | Resp 16 | Ht 72.0 in | Wt 242.0 lb

## 2018-04-08 DIAGNOSIS — R102 Pelvic and perineal pain: Secondary | ICD-10-CM

## 2018-04-08 DIAGNOSIS — M25552 Pain in left hip: Secondary | ICD-10-CM

## 2018-04-08 DIAGNOSIS — R202 Paresthesia of skin: Secondary | ICD-10-CM | POA: Diagnosis not present

## 2018-04-08 DIAGNOSIS — E7800 Pure hypercholesterolemia, unspecified: Secondary | ICD-10-CM

## 2018-04-08 DIAGNOSIS — I480 Paroxysmal atrial fibrillation: Secondary | ICD-10-CM

## 2018-04-08 DIAGNOSIS — M79642 Pain in left hand: Secondary | ICD-10-CM | POA: Diagnosis not present

## 2018-04-08 DIAGNOSIS — M65321 Trigger finger, right index finger: Secondary | ICD-10-CM

## 2018-04-08 DIAGNOSIS — Z8589 Personal history of malignant neoplasm of other organs and systems: Secondary | ICD-10-CM

## 2018-04-08 DIAGNOSIS — Z8709 Personal history of other diseases of the respiratory system: Secondary | ICD-10-CM

## 2018-04-08 DIAGNOSIS — M79641 Pain in right hand: Secondary | ICD-10-CM | POA: Diagnosis not present

## 2018-04-08 DIAGNOSIS — M79672 Pain in left foot: Secondary | ICD-10-CM

## 2018-04-08 DIAGNOSIS — M65341 Trigger finger, right ring finger: Secondary | ICD-10-CM

## 2018-04-08 DIAGNOSIS — Z8659 Personal history of other mental and behavioral disorders: Secondary | ICD-10-CM

## 2018-04-08 DIAGNOSIS — R768 Other specified abnormal immunological findings in serum: Secondary | ICD-10-CM

## 2018-04-08 DIAGNOSIS — M79671 Pain in right foot: Secondary | ICD-10-CM | POA: Diagnosis not present

## 2018-04-08 DIAGNOSIS — I10 Essential (primary) hypertension: Secondary | ICD-10-CM

## 2018-04-08 DIAGNOSIS — Z86010 Personal history of colonic polyps: Secondary | ICD-10-CM

## 2018-04-08 NOTE — Patient Instructions (Signed)

## 2018-04-11 LAB — CYCLIC CITRUL PEPTIDE ANTIBODY, IGG

## 2018-04-11 LAB — 14-3-3 ETA PROTEIN

## 2018-04-19 ENCOUNTER — Encounter (HOSPITAL_COMMUNITY): Payer: Self-pay

## 2018-04-22 ENCOUNTER — Encounter

## 2018-04-22 ENCOUNTER — Encounter: Payer: BC Managed Care – PPO | Admitting: Neurology

## 2018-04-22 ENCOUNTER — Other Ambulatory Visit (HOSPITAL_COMMUNITY): Payer: Self-pay | Admitting: *Deleted

## 2018-04-22 ENCOUNTER — Telehealth (HOSPITAL_COMMUNITY): Payer: Self-pay | Admitting: *Deleted

## 2018-04-22 DIAGNOSIS — I4819 Other persistent atrial fibrillation: Secondary | ICD-10-CM

## 2018-04-22 NOTE — Telephone Encounter (Signed)
Patient called in stating "after much research" he would like to discuss ablation for treatment of his AF. Pt would like referral to Dr. Rayann Heman. Educated pt on guidelines for AF ablation that normally AAD therapy should be tried/failed prior to ablation especially since duration of persistent AF is unknown. Pt would like to still talk to Dr. Rayann Heman for first line ablation. Discussed with Roderic Palau NP -- ok with referral.

## 2018-04-26 ENCOUNTER — Other Ambulatory Visit: Payer: Self-pay | Admitting: Neurology

## 2018-04-26 ENCOUNTER — Encounter: Payer: Self-pay | Admitting: Rheumatology

## 2018-04-26 ENCOUNTER — Telehealth: Payer: Self-pay | Admitting: Neurology

## 2018-04-26 DIAGNOSIS — G541 Lumbosacral plexus disorders: Secondary | ICD-10-CM

## 2018-04-26 NOTE — Telephone Encounter (Signed)
[employer] pending faxed clinical notes  °

## 2018-04-26 NOTE — Telephone Encounter (Signed)
Please call patient and check if he could come on a Wednesday afternoon for ultrasound-guided cortisone injection.

## 2018-04-27 NOTE — Telephone Encounter (Signed)
Let patient know insurance won't approve the image and he would have to pay out of poket unfortunately. thanks

## 2018-04-27 NOTE — Telephone Encounter (Signed)
Just an FYI   I informed the patient that the insurance is not approving the MRI Pelvis.. I informed him that he would have to pay out of pocket.Marland Kitchen He stated he will hold off on having it right now.. I told him if he changed his mind down the road to let us know.

## 2018-04-27 NOTE — Telephone Encounter (Signed)
I called [employer] to speak to a nurse reviewer and gave more clinical information. I spoke to Collie Siad with [place] she informed me she could not approve it on her level.. If you would like to do a peer to peer the phone number is [phone]. The member ID is [id number] and the DOB is 1956-08-07. The case does close on 04/28/18.

## 2018-04-28 DIAGNOSIS — M65321 Trigger finger, right index finger: Secondary | ICD-10-CM | POA: Insufficient documentation

## 2018-04-28 DIAGNOSIS — M19042 Primary osteoarthritis, left hand: Secondary | ICD-10-CM | POA: Insufficient documentation

## 2018-04-28 DIAGNOSIS — M19071 Primary osteoarthritis, right ankle and foot: Secondary | ICD-10-CM | POA: Insufficient documentation

## 2018-04-28 DIAGNOSIS — M1612 Unilateral primary osteoarthritis, left hip: Secondary | ICD-10-CM | POA: Insufficient documentation

## 2018-04-28 DIAGNOSIS — M19072 Primary osteoarthritis, left ankle and foot: Secondary | ICD-10-CM

## 2018-04-28 DIAGNOSIS — M19041 Primary osteoarthritis, right hand: Secondary | ICD-10-CM | POA: Insufficient documentation

## 2018-04-28 NOTE — Progress Notes (Deleted)
Office Visit Note  Patient: Calvin Collins             Date of Birth: 02-21-1956           MRN: [id number]             PCP: Harlan Stains, MD Referring: Harlan Stains, MD Visit Date: 05/11/2018 Occupation: @GUAROCC @  Subjective:  No chief complaint on file.   History of Present Illness: Calvin Collins is a 62 y.o. male ***   Activities of Daily Living:  Patient reports morning stiffness for *** {minute/hour:19697}.   Patient {ACTIONS;DENIES/REPORTS:21021675::"Denies"} nocturnal pain.  Difficulty dressing/grooming: {ACTIONS;DENIES/REPORTS:21021675::"Denies"} Difficulty climbing stairs: {ACTIONS;DENIES/REPORTS:21021675::"Denies"} Difficulty getting out of chair: {ACTIONS;DENIES/REPORTS:21021675::"Denies"} Difficulty using hands for taps, buttons, cutlery, and/or writing: {ACTIONS;DENIES/REPORTS:21021675::"Denies"}  No Rheumatology ROS completed.   PMFS History:  Patient Active Problem List   Diagnosis Date Noted  . Paresthesias 03/08/2018  . Atypical atrial flutter ([place])   . Asthma with allergic rhinitis 10/17/2011  . Paroxysmal atrial fibrillation (HCC)   . Hypertension   . Obesity   . ED (erectile dysfunction)   . Anxiety     Past Medical History:  Diagnosis Date  . Anxiety   . Asthmatic bronchitis   . ED (erectile dysfunction)   . GERD (gastroesophageal reflux disease)   . History of colon polyps   . Hypercholesteremia   . Hypertension   . Obesity   . Persistent atrial fibrillation ([place]) 02/02/2018  . Squamous cell carcinoma of skin   . SVT (supraventricular tachycardia) (HCC)     Family History  Problem Relation Age of Onset  . Emphysema Father   . Hypertension Father   . Emphysema Paternal Uncle   . Other Daughter        liver transplant  . Hypertension Mother   . Stroke Mother   . Cancer Brother   . Skin cancer Brother        basal cell  . Skin cancer Sister        basal cell   Past Surgical History:  Procedure Laterality Date  .  CARDIOVERSION N/A 03/02/2018   Procedure: CARDIOVERSION;  Surgeon: Jerline Pain, MD;  Location: [facility] ENDOSCOPY;  Service: Cardiovascular;  Laterality: N/A;  . left arm surgery    . skin cancer removed    . WISDOM TOOTH EXTRACTION     Social History   Social History Narrative   Lives in [place].  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  Attends [employer]    Objective: Vital Signs: There were no vitals taken for this visit.   Physical Exam   Musculoskeletal Exam: ***  CDAI Exam: CDAI Score: Not documented Patient Global Assessment: Not documented; Provider Global Assessment: Not documented Swollen: Not documented; Tender: Not documented Joint Exam   Not documented   There is currently no information documented on the homunculus. Go to the Rheumatology activity and complete the homunculus joint exam.  Investigation: Findings:  03/08/2018: Thiamine 141.3, Lyme antibody negative, vitamin B6 10.6, RF 22.1, TSH 1.0, HIV negative, RPR negative, hep C negative, immunoglobulins within normal limits, SPEP normal, methyl peroxidase antibody 12.4, ANCA 6.4, C-ANCA <1:20, P-ANCA <1:20, atypical PANCA 1:20, lead not detected, arsenic 5, mercury 1.7, Ro <0.2, La <0.2, vitamin B12 390, folate 12.9, CRP less than 1, sed rate 5, ferritin 308   Imaging: Xr Hip Unilat W Or W/o Pelvis 2-3 Views Left  Result Date: 04/08/2018 No significant hip joint narrowing was noted. sclerosis of the acetabulum was noted.  Small spur was noted.  Cystic changes were noted in the femoral head.  No SI joint changes were noted. Impression: These findings are consistent with mild osteoarthritis of the hip joint.  Xr Foot 2 Views Left  Result Date: 04/08/2018 First MTP, all PIP and DIP joint space narrowing was noted.  No erosive changes were noted.  No intertarsal joint space narrowing was noted.  No tibiotalar or subtalar joint space narrowing was noted.  A posterior calcaneal spur was noted.  Impression: These findings are consistent with osteoarthritis of the foot.  Xr Foot 2 Views Right  Result Date: 04/08/2018 First MTP, all PIP and DIP joint space narrowing was noted.  No erosive changes were noted.  No intertarsal joint space narrowing was noted.  No tibiotalar or subtalar joint space narrowing was noted.  A posterior calcaneal spur was noted. Impression: These findings are consistent with osteoarthritis of the foot.  Xr Hand 2 View Left  Result Date: 04/08/2018 PIP and DIP narrowing was noted.  CMC narrowing was noted.  No MCP intercarpal radiocarpal joint space narrowing was noted.  No erosive changes were noted.  A plate was noted in the left radius. Impression: These findings are consistent with osteoarthritis of the hand.  Xr Hand 2 View Right  Result Date: 04/08/2018 PIP and DIP narrowing was noted.  No MCP intercarpal radiocarpal joint space narrowing was noted.  No erosive changes were noted. Impression: These findings are consistent with osteoarthritis of the hand.   Recent Labs: Lab Results  Component Value Date   WBC 5.6 02/25/2018   HGB 15.9 02/25/2018   PLT 228 02/25/2018   NA 140 02/25/2018   K 4.4 02/25/2018   CL 111 02/25/2018   CO2 23 02/25/2018   GLUCOSE 104 (H) 02/25/2018   BUN 9 02/25/2018   CREATININE 1.02 02/25/2018   PROT 7.0 03/08/2018   CALCIUM 9.6 02/25/2018   GFRAA >60 02/25/2018    April 08, 2018 anti-CCP negative, 14 3 3  at a negative Speciality Comments: No specialty comments available.  Procedures:  No procedures performed Allergies: Paroxetine   Assessment / Plan:     Visit Diagnoses: No diagnosis found.   Orders: No orders of the defined types were placed in this encounter.  No orders of the defined types were placed in this encounter.   Face-to-face time spent with patient was *** minutes. Greater than 50% of time was spent in counseling and coordination of care.  Follow-Up Instructions: No follow-ups on  file.   Bo Merino, MD  Note - This record has been created using Editor, commissioning.  Chart creation errors have been sought, but may not always  have been located. Such creation errors do not reflect on  the standard of medical care.

## 2018-05-03 ENCOUNTER — Ambulatory Visit: Payer: BC Managed Care – PPO | Admitting: Neurology

## 2018-05-03 ENCOUNTER — Encounter

## 2018-05-11 ENCOUNTER — Ambulatory Visit: Payer: BC Managed Care – PPO | Admitting: Rheumatology

## 2018-05-12 ENCOUNTER — Ambulatory Visit (HOSPITAL_COMMUNITY)
Admission: RE | Admit: 2018-05-12 | Discharge: 2018-05-12 | Disposition: A | Discharge status: Home / Self Care | Payer: BC Managed Care – PPO | Source: Ambulatory Visit | Attending: Nurse Practitioner | Admitting: Nurse Practitioner

## 2018-05-12 DIAGNOSIS — E785 Hyperlipidemia, unspecified: Secondary | ICD-10-CM | POA: Diagnosis not present

## 2018-05-12 DIAGNOSIS — I471 Supraventricular tachycardia: Secondary | ICD-10-CM | POA: Diagnosis not present

## 2018-05-12 DIAGNOSIS — I081 Rheumatic disorders of both mitral and tricuspid valves: Secondary | ICD-10-CM | POA: Insufficient documentation

## 2018-05-12 DIAGNOSIS — I481 Persistent atrial fibrillation: Secondary | ICD-10-CM | POA: Diagnosis not present

## 2018-05-12 DIAGNOSIS — I119 Hypertensive heart disease without heart failure: Secondary | ICD-10-CM | POA: Diagnosis not present

## 2018-05-12 DIAGNOSIS — I4819 Other persistent atrial fibrillation: Secondary | ICD-10-CM

## 2018-05-12 NOTE — Progress Notes (Signed)
  Echocardiogram 2D Echocardiogram has been performed.  Calvin Collins M 05/12/2018, 8:36 AM

## 2018-05-14 ENCOUNTER — Encounter: Payer: Self-pay | Admitting: Internal Medicine

## 2018-05-17 ENCOUNTER — Ambulatory Visit: Payer: BC Managed Care – PPO | Admitting: Internal Medicine

## 2018-05-17 ENCOUNTER — Encounter: Payer: Self-pay | Admitting: Internal Medicine

## 2018-05-17 VITALS — BP 148/86 | HR 92 | Ht 72.0 in | Wt 241.8 lb

## 2018-05-17 DIAGNOSIS — I10 Essential (primary) hypertension: Secondary | ICD-10-CM | POA: Diagnosis not present

## 2018-05-17 DIAGNOSIS — I481 Persistent atrial fibrillation: Secondary | ICD-10-CM | POA: Diagnosis not present

## 2018-05-17 DIAGNOSIS — I4819 Other persistent atrial fibrillation: Secondary | ICD-10-CM

## 2018-05-17 NOTE — Patient Instructions (Addendum)
Medication Instructions:  Your physician recommends that you continue on your current medications as directed. Please refer to the Current Medication list given to you today.  Labwork: None ordered.  Testing/Procedures: None ordered.  Follow-Up: Your physician wants you to follow-up in: 6 weeks with Dr. Rayann Heman.     Any Other Special Instructions Will Be Listed Below (If Applicable).  If you need a refill on your cardiac medications before your next appointment, please call your pharmacy.

## 2018-05-17 NOTE — Progress Notes (Signed)
Electrophysiology Office Note   Date:  05/17/2018   ID:  Calvin Collins, DOB Aug 31, 1956, MRN [id number]  PCP:  Harlan Stains, MD    Primary Electrophysiologist: Thompson Grayer, MD    CC: afib   History of Present Illness: Calvin Collins is a 62 y.o. male who presents today for electrophysiology evaluation.  The patient reports having persistent afib since June 2019.  He has palpitations.  Cardioversion 03/02/18 lasted only 48 hours.  In retrospect he has had "arrhythmia" for about 12 years.  He has had increasing palpitations since that time.  He has not yet tried AAD therapy.  He is tolerating afib well.  Today, he denies symptoms of chest pain, shortness of breath, orthopnea, PND, lower extremity edema, claudication, dizziness, presyncope, syncope, bleeding, or neurologic sequela. The patient is tolerating medications without difficulties and is otherwise without complaint today.    Past Medical History:  Diagnosis Date  . Anxiety   . Asthmatic bronchitis   . ED (erectile dysfunction)   . GERD (gastroesophageal reflux disease)   . History of colon polyps   . Hypercholesteremia   . Hypertension   . Obesity   . Persistent atrial fibrillation ([place]) 02/02/2018  . Squamous cell carcinoma of skin   . SVT (supraventricular tachycardia) (HCC)    Past Surgical History:  Procedure Laterality Date  . CARDIOVERSION N/A 03/02/2018   Procedure: CARDIOVERSION;  Surgeon: Jerline Pain, MD;  Location: [facility] ENDOSCOPY;  Service: Cardiovascular;  Laterality: N/A;  . left arm surgery    . skin cancer removed    . WISDOM TOOTH EXTRACTION       Current Outpatient Medications  Medication Sig Dispense Refill  . albuterol (PROVENTIL HFA;VENTOLIN HFA) 108 (90 Base) MCG/ACT inhaler Inhale into the lungs every 6 (six) hours as needed for wheezing or shortness of breath.    . ALPRAZolam (XANAX) 0.25 MG tablet Take 1-2 tabs (0.25mg -0.50mg ) 30-60 minutes before procedure. May repeat if needed.Do not  drive. 4 tablet 0  . apixaban (ELIQUIS) 5 MG TABS tablet Take 1 tablet (5 mg total) by mouth 2 (two) times daily. 60 tablet 6  . budesonide (PULMICORT) 180 MCG/ACT inhaler Inhale 1-2 puffs into the lungs daily.     Marland Kitchen LORazepam (ATIVAN) 1 MG tablet Take 1 mg by mouth 2 (two) times daily as needed for anxiety. Seldom takes this    . nebivolol (BYSTOLIC) 2.5 MG tablet Take 2.5 mg by mouth daily.    . Probiotic Product (ALIGN) 4 MG CAPS Take 1 capsule by mouth daily.    . sildenafil (VIAGRA) 50 MG tablet Take 50 mg by mouth daily as needed for erectile dysfunction.    . vardenafil (LEVITRA) 20 MG tablet Take 20 mg by mouth daily as needed for erectile dysfunction.     No current facility-administered medications for this visit.     Allergies:   Paroxetine   Social History:  The patient  reports that he has never smoked. He has never used smokeless tobacco. He reports that he drank alcohol. He reports that he does not use drugs.   Family History:  The patient's family history includes Cancer in his brother; Emphysema in his father and paternal uncle; Hypertension in his father and mother; Other in his daughter; Skin cancer in his brother and sister; Stroke in his mother.    ROS:  Please see the history of present illness.   All other systems are personally reviewed and negative.    PHYSICAL EXAM: VS:  BP (!) 148/86   Pulse 92   Ht 6' (1.829 m)   Wt 241 lb 12.8 oz (109.7 kg)   SpO2 99%   BMI 32.79 kg/m  , BMI Body mass index is 32.79 kg/m. GEN: Well nourished, well developed, in no acute distress  HEENT: normal  Neck: no JVD, carotid bruits, or masses Cardiac: iRRR; no murmurs, rubs, or gallops,no edema  Respiratory:  clear to auscultation bilaterally, normal work of breathing GI: soft, nontender, nondistended, + BS MS: no deformity or atrophy  Skin: warm and dry  Neuro:  Strength and sensation are intact Psych: euthymic mood, full affect  EKG:  EKG is ordered today. The ekg  ordered today is personally reviewed and shows afib, V rate 92 bpm, Qtc 408 msec   Recent Labs: 02/25/2018: BUN 9; Creatinine, Ser 1.02; Hemoglobin 15.9; Platelets 228; Potassium 4.4; Sodium 140 03/08/2018: TSH 1.000  personally reviewed   Lipid Panel  No results found for: CHOL, TRIG, HDL, CHOLHDL, VLDL, LDLCALC, LDLDIRECT personally reviewed   Wt Readings from Last 3 Encounters:  05/17/18 241 lb 12.8 oz (109.7 kg)  04/08/18 242 lb (109.8 kg)  04/05/18 240 lb (108.9 kg)      Other studies personally reviewed: Additional studies/ records that were reviewed today include: AF clinic notes, prior echo  Review of the above records today demonstrates: echo 05/12/18 reveals EF 50-55%, mild MR, mild biatrial enlargement (LA 6mm though on prior echo was 47 mm)   ASSESSMENT AND PLAN:  1.  Persistent afib Minimally symptomatic Has not tried AADs Chads2vasc score is 1.  he is anticoagulated with eliquis . Therapeutic strategies for afib including rate control and rhythm control (flecainide, tikosyn) were discussed in detail with the patient today. Risk, benefits, and alternatives to each approach was discussed at length.  He will think about options. If he decides to pursue rate control, I think a ETT to evaluate for heart rates with exercise would be beneficial. If he decides to pursue rhythm control, I would advise flecainide 50mg  BID x 3 days with follow-up EKG and then increase flecainide to 100mg  BID if remains in AF and no worrisome ekg findings at that time followed by [facility] 1 week later.  2. HTN Stable No change required today   Follow-up:  Return to see me in 6 weeks  Current medicines are reviewed at length with the patient today.   The patient does not have concerns regarding his medicines.  The following changes were made today:  none  Labs/ tests ordered today include:  Orders Placed This Encounter  Procedures  . EKG 12-Lead     Signed, Thompson Grayer, MD  05/17/2018 9:15  AM     [facility] [address] [place] [address] [phone] (office) [phone] (fax)

## 2018-06-10 ENCOUNTER — Other Ambulatory Visit: Payer: Self-pay

## 2018-06-10 MED ORDER — FLECAINIDE ACETATE 50 MG PO TABS: 50.0000 mg | ORAL_TABLET | Freq: Two times a day (BID) | ORAL | 11 refills | Status: DC

## 2018-06-10 NOTE — Progress Notes (Signed)
See Dr. Rayann Heman last [facility] for orders received.

## 2018-06-15 ENCOUNTER — Telehealth (HOSPITAL_COMMUNITY): Payer: Self-pay | Admitting: *Deleted

## 2018-06-15 ENCOUNTER — Ambulatory Visit (HOSPITAL_COMMUNITY)
Admission: RE | Admit: 2018-06-15 | Discharge: 2018-06-15 | Disposition: A | Discharge status: Home / Self Care | Payer: BC Managed Care – PPO | Source: Ambulatory Visit | Attending: Nurse Practitioner | Admitting: Nurse Practitioner

## 2018-06-15 DIAGNOSIS — I4891 Unspecified atrial fibrillation: Secondary | ICD-10-CM | POA: Insufficient documentation

## 2018-06-15 NOTE — Progress Notes (Signed)
Pt in for EKG has noted some shortness of breath like he cannot take a deep breath since staring flecainide. Ekg shows afib at 87 bpm, qrs int 86 ms, qtc 430 ms. Pt reassured. He has not noted any shortness of breath with exertion or at night. He has EKG pending with Dr.  Allred on Thursday. He will continue 50 mg flecainide bid.  

## 2018-06-15 NOTE — Telephone Encounter (Signed)
Patient called in this morning stating since starting flecainide on Monday he is having increased shortness of breath and dizziness. The dizziness does dissipate eventually but just feels he cannot take a good breath. Pt denies weight gain or swelling. Will bring in for EKG. HR according to his fitbit is in the 53s.

## 2018-06-17 ENCOUNTER — Ambulatory Visit (INDEPENDENT_AMBULATORY_CARE_PROVIDER_SITE_OTHER): Payer: BC Managed Care – PPO | Admitting: *Deleted

## 2018-06-17 VITALS — BP 140/92 | HR 75 | Ht 72.0 in | Wt 142.2 lb

## 2018-06-17 DIAGNOSIS — I4819 Other persistent atrial fibrillation: Secondary | ICD-10-CM | POA: Diagnosis not present

## 2018-06-17 MED ORDER — FLECAINIDE ACETATE 100 MG PO TABS: 100.0000 mg | ORAL_TABLET | Freq: Two times a day (BID) | ORAL | 3 refills | Status: DC

## 2018-06-17 NOTE — Patient Instructions (Signed)
Pt is aware to increased the Flecainide dose to 100 mg twice a day. Prescription sent to [facility]. Pt is aware to take 2/ 50 mg tables twice a day until finishes what he have on hand now. Pt D/C home in satisfactory condition.

## 2018-06-17 NOTE — Progress Notes (Signed)
1.) Reason for visit: 12 lead EKG  2.) Name of MD requesting visit:Allred  3.) H&P: Persistent A-fibrillation, PAF, Hypertension,Atypical atrial flutter.  4.) ROS related to problem: Persistent A-fibrillation.  5.) Assessment and plan per MD: EKG read per Dr. Rayann Heman. A-fibrillation rate 75 beats/minute. BP 140/92. Pt states that he notice having  SOB prior starting the flecainide. Pt states that the sob   is a little better.             Pt to increase Flecainide to 100 mg BID. Prescription sent to [facility]. Pt has an appointment with DR. Allred on Monday October 21 st 2019. Pt is aware.

## 2018-06-23 ENCOUNTER — Telehealth: Payer: Self-pay | Admitting: Internal Medicine

## 2018-06-23 NOTE — Telephone Encounter (Signed)
  Pt c/o medication issue:  1. Name of Medication: flecainide (TAMBOCOR) 100 MG tablet  2. How are you currently taking this medication (dosage and times per day)? Take 1 tablet (100 mg total) by mouth 2 (two) times daily.  3. Are you having a reaction (difficulty breathing--STAT)?  Shortness of breath, no other symptoms  4. What is your medication issue? Since starting this medication the patient is having trouble getting a deep breath. Breathing has been shallow.

## 2018-06-23 NOTE — Telephone Encounter (Signed)
Pt called in C/O of SOB. Pt states, that for the last 2 days the SOB is getting worse. Pt's flecainide medication was increased from 50 mg to 100 mg twice a day on 06/17/09 after an EKG was done here in this office. Pt was seen in the A-fib clinic by Roderic Palau NP on 06/15/18. Pt states that he takes shallow breaths. He  take a deep breath every 10 to 15 minutes, because he feels that he does not have enough oxygen in him. Pt feels that his heart rate has slow down with the Flecainide.  Spoke with Myrtie Hawk RN Dr. Jackalyn Lombard nurse. She said that she got the [place] message from pt,and  is waiting to get an answer from Dr. Rayann Heman. Pt is aware that Dr allred's nurse will call him back as soon as she get an answer from MD. Pt appears as being nervous about this issue.

## 2018-06-24 ENCOUNTER — Ambulatory Visit
Admission: RE | Admit: 2018-06-24 | Discharge: 2018-06-24 | Disposition: A | Discharge status: Home / Self Care | Payer: BC Managed Care – PPO | Source: Ambulatory Visit | Attending: Family Medicine | Admitting: Family Medicine

## 2018-06-24 ENCOUNTER — Other Ambulatory Visit: Payer: Self-pay | Admitting: Family Medicine

## 2018-06-24 DIAGNOSIS — R0600 Dyspnea, unspecified: Secondary | ICD-10-CM

## 2018-06-28 ENCOUNTER — Ambulatory Visit: Payer: BC Managed Care – PPO | Admitting: Internal Medicine

## 2018-06-28 ENCOUNTER — Encounter: Payer: Self-pay | Admitting: Internal Medicine

## 2018-06-28 VITALS — BP 120/88 | HR 98 | Ht 72.0 in | Wt 231.2 lb

## 2018-06-28 DIAGNOSIS — I10 Essential (primary) hypertension: Secondary | ICD-10-CM | POA: Diagnosis not present

## 2018-06-28 DIAGNOSIS — I4819 Other persistent atrial fibrillation: Secondary | ICD-10-CM

## 2018-06-28 MED ORDER — NEBIVOLOL HCL 5 MG PO TABS: 5.0000 mg | ORAL_TABLET | Freq: Every day | ORAL | 11 refills | Status: DC

## 2018-06-28 NOTE — Patient Instructions (Addendum)
Medication Instructions:  Your physician has recommended you make the following change in your medication:  1.  Take Bystolic 5 mg--  Take one tablet by mouth daily  Labwork: None ordered.  Testing/Procedures: None ordered.  Follow-Up: Your physician wants you to follow-up in: 6 weeks with Roderic Palau NP at the AFIB clinic.    Any Other Special Instructions Will Be Listed Below (If Applicable).  If you need a refill on your cardiac medications before your next appointment, please call your pharmacy.

## 2018-06-28 NOTE — Progress Notes (Signed)
PCP: Harlan Stains, MD   Primary EP: Dr Rayann Heman  Calvin Collins is a 62 y.o. male who presents today for routine electrophysiology followup.  Since last being seen in our clinic, the patient reports doing reasonably well.  He has developed severe, debilitating anxiety.  With this, he has tremulousness and SOB.  In retrospect, he does not think that flecainide caused his SOB but has stopped this medicine early last week.  Today, he denies symptoms of palpitations, chest pain,  lower extremity edema, dizziness, presyncope, or syncope.  The patient is otherwise without complaint today.   Past Medical History:  Diagnosis Date  . Anxiety   . Asthmatic bronchitis   . ED (erectile dysfunction)   . GERD (gastroesophageal reflux disease)   . History of colon polyps   . Hypercholesteremia   . Hypertension   . Obesity   . Persistent atrial fibrillation 02/02/2018  . Squamous cell carcinoma of skin   . SVT (supraventricular tachycardia) (HCC)    Past Surgical History:  Procedure Laterality Date  . CARDIOVERSION N/A 03/02/2018   Procedure: CARDIOVERSION;  Surgeon: Jerline Pain, MD;  Location: [facility] ENDOSCOPY;  Service: Cardiovascular;  Laterality: N/A;  . left arm surgery    . skin cancer removed    . WISDOM TOOTH EXTRACTION      ROS- all systems are reviewed and negatives except as per HPI above  Current Outpatient Medications  Medication Sig Dispense Refill  . albuterol (PROVENTIL HFA;VENTOLIN HFA) 108 (90 Base) MCG/ACT inhaler Inhale into the lungs every 6 (six) hours as needed for wheezing or shortness of breath.    . ALPRAZolam (XANAX) 0.25 MG tablet Take 1-2 tabs (0.25mg -0.50mg ) 30-60 minutes before procedure. May repeat if needed.Do not drive. 4 tablet 0  . apixaban (ELIQUIS) 5 MG TABS tablet Take 1 tablet (5 mg total) by mouth 2 (two) times daily. 60 tablet 6  . budesonide (PULMICORT) 180 MCG/ACT inhaler Inhale 1-2 puffs into the lungs daily.     . flecainide (TAMBOCOR) 100 MG  tablet Take 1 tablet (100 mg total) by mouth 2 (two) times daily. 180 tablet 3  . LORazepam (ATIVAN) 1 MG tablet Take 1 mg by mouth 2 (two) times daily as needed for anxiety. Seldom takes this    . nebivolol (BYSTOLIC) 2.5 MG tablet Take 2.5 mg by mouth daily.    . Probiotic Product (ALIGN) 4 MG CAPS Take 1 capsule by mouth daily.    . sildenafil (VIAGRA) 50 MG tablet Take 50 mg by mouth daily as needed for erectile dysfunction.    . vardenafil (LEVITRA) 20 MG tablet Take 20 mg by mouth daily as needed for erectile dysfunction.     No current facility-administered medications for this visit.     Physical Exam: Vitals:   06/28/18 1123  BP: 120/88  Pulse: 98  SpO2: 98%  Weight: 231 lb 3.2 oz (104.9 kg)  Height: 6' (1.829 m)    GEN- The patient is well appearing, alert and oriented x 3 today.   Head- normocephalic, atraumatic Eyes-  Sclera clear, conjunctiva pink Ears- hearing intact Oropharynx- clear Lungs- Clear to ausculation bilaterally, normal work of breathing Heart- irregular rate and rhythm, no murmurs, rubs or gallops, PMI not laterally displaced GI- soft, NT, ND, + BS Extremities- no clubbing, cyanosis, or edema Psych- very anxious appearing  Wt Readings from Last 3 Encounters:  06/28/18 231 lb 3.2 oz (104.9 kg)  06/17/18 142 lb 4 oz (64.5 kg)  05/17/18 241  lb 12.8 oz (109.7 kg)    EKG tracing ordered today is personally reviewed and shows typical appearing atrial flutter, V rate 98 bpm  Assessment and Plan:  1. Persistent afib/ typical appearing atrial flutter The patient has minimally symptomatic, recurrent persistent atrial fibrillation and atrial flutter. he has failed medical therapy with flecainide (though I suspect that symptoms of SOB were due to anxiety and not afib).  He is very concerned about his daughter who is being evaluated for recurrence of lymphoma. Chads2vasc score is 1.  he is anticoagulated with eliquis.   At this time, I would advise rate  control and conservative management.  We could consider trying a different AAD or ablation should his anxiety improve.  I would probably favor ablation at this time, worrying about his ability to take medicines long term.  He is very anxious today and in no condition to discuss procedures or AADs. I did increase his bystolic to 5mg  daily which may help with rate control and also symptoms of anxiety.   2. Anxiety Ok to use zoloft or other medicines from my standpoint.  I think that we should focus on treatment of anxiety at this time.  I can then work around these medicines for AF management thereafter.  3. HTN Stable No change required today   Follow-up in AF clinic in 6 weeks I will see again in 3 months  Thompson Grayer MD, [facility] 06/28/2018 12:03 PM

## 2018-07-02 ENCOUNTER — Encounter: Payer: Self-pay | Admitting: Psychiatry

## 2018-07-02 ENCOUNTER — Ambulatory Visit: Payer: BC Managed Care – PPO | Admitting: Psychiatry

## 2018-07-02 VITALS — BP 131/94 | HR 82 | Ht 73.0 in | Wt 230.0 lb

## 2018-07-02 DIAGNOSIS — F4001 Agoraphobia with panic disorder: Secondary | ICD-10-CM

## 2018-07-02 DIAGNOSIS — F411 Generalized anxiety disorder: Secondary | ICD-10-CM | POA: Diagnosis not present

## 2018-07-02 MED ORDER — LORAZEPAM 1 MG PO TABS: 1.0000 mg | ORAL_TABLET | Freq: Three times a day (TID) | ORAL | 1 refills | Status: DC | PRN

## 2018-07-02 NOTE — Progress Notes (Signed)
[facility] MD/PA/NP Initial Note  07/02/2018 4:27 PM Dreyden Rohrman  MRN:  [id number]  Chief Complaint:  Chief Complaint    Anxiety; Depression     HPI: Had been doing great off Zoloft with manageable anxiety spells.  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  Handled that well.  She got into remission for a year.  4-5 weeks onset similar sx.  Didn't handle real well with marked anxiety.  Last Friday PET scan suggest possible recurrence.   April of this year developed nerve pain all over his body.  Multiple workup without conclusion.  May 29 afib started and not out since then.  Failed conversion and flecanide.  Happened around the time of Molly's treatment.  SOB with Flecanide then anxiety and panic associated.  Still has a lot of SOB.  Card rec he see psychiatrist over the anxiety.  Never had SOB with panic before.  Now continuous.  Relapse anxiety, panic like in the past but understands and manages it.  Surprised by the sx.  Hard to work DT SOB. Sx keep him from exercise.  Lost 35# since May DT anxiety  Not much depression.  Tearful for 3 weeks Reduced concentration.  Shakes inside and outside. Air hunger.  Has missed work due to symptoms.  Can't stay asleep, EMA.  4-5 hours typical.  Several months.  Rarely takes lorazepam bc fear of addiction.Little caffeine.  No alcohol DT afib.  Saw Cyntia White PCP w/u OK.  She suggested Buspar.  Wife doesn't what him to go back on psych meds and handle this with faith alone.  Visit Diagnosis:    ICD-10-CM   1. Panic disorder with agoraphobia F40.01 LORazepam (ATIVAN) 1 MG tablet  2. Generalized anxiety disorder F41.1     Past Psychiatric History: Was under the care of this practice from 1999 until 2016 with a diagnosis of panic disorder and GAD and a history of major depression. Hx good response sertraline 25-50 mg/d plus some lorazepam.  Plus had psychotherapy with Dr. and remission Past psych med trials include paroxetine which was not tolerated well  and sertraline 50 mg from 2004 until 2011 when it was cut back to 37.5 and gradually weaned over a couple of years he took lorazepam for anxiety intermittently and also for sleep at 1 mg.  Had sexual side effects from the sertraline and used Levitra or Viagra. Past Medical History:  Past Medical History:  Diagnosis Date  . Anxiety   . Asthmatic bronchitis   . ED (erectile dysfunction)   . GERD (gastroesophageal reflux disease)   . History of colon polyps   . Hypercholesteremia   . Hypertension   . Obesity   . Persistent atrial fibrillation 02/02/2018  . Squamous cell carcinoma of skin   . SVT (supraventricular tachycardia) (HCC)     Past Surgical History:  Procedure Laterality Date  . CARDIOVERSION N/A 03/02/2018   Procedure: CARDIOVERSION;  Surgeon: Jerline Pain, MD;  Location: [facility] ENDOSCOPY;  Service: Cardiovascular;  Laterality: N/A;  . left arm surgery    . skin cancer removed    . WISDOM TOOTH EXTRACTION      Family Psychiatric History: Father had a history of depression for 15 years with panic attacks.  Both parents were intense worriers.  Paternal grandfather had senile dementia.  Mother had anxiety attacks and was prescribed Valium after his birth.  He has 3 brothers and 1 sister.  His brother had a history of depression.  Paternal uncle had major  depression and OCD and required ECT.  A paternal aunt had depression and required ECT and another paternal uncle had bipolar disorder  Family History:  Family History  Problem Relation Age of Onset  . Emphysema Father   . Hypertension Father   . Emphysema Paternal Uncle   . Other Daughter        liver transplant  . Hypertension Mother   . Stroke Mother   . Cancer Brother   . Skin cancer Brother        basal cell  . Skin cancer Sister        basal cell    Social History:  Social History   Socioeconomic History  . Marital status: Married    Spouse name: Not on file  . Number of children: 4  . Years of education: Not  on file  . Highest education level: Not on file  Occupational History  . Occupation: Secretary/administrator Professor  Social Needs  . Financial resource strain: Not on file  . Food insecurity:    Worry: Not on file    Inability: Not on file  . Transportation needs:    Medical: Not on file    Non-medical: Not on file  Tobacco Use  . Smoking status: Never Smoker  . Smokeless tobacco: Never Used  Substance and Sexual Activity  . Alcohol use: Not Currently  . Drug use: No  . Sexual activity: Not on file  Lifestyle  . Physical activity:    Days per week: Not on file    Minutes per session: Not on file  . Stress: Not on file  Relationships  . Social connections:    Talks on phone: Not on file    Gets together: Not on file    Attends religious service: Not on file    Active member of club or organization: Not on file    Attends meetings of clubs or organizations: Not on file    Relationship status: Not on file  Other Topics Concern  . Not on file  Social History Narrative   Lives in [place].  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  Attends [employer]  He and his wife have strong Christian faith and they are active at [employer].  Allergies:  Allergies  Allergen Reactions  . Paroxetine     60 lb weight gain    Metabolic Disorder Labs: No results found for: HGBA1C, MPG No results found for: PROLACTIN No results found for: CHOL, TRIG, HDL, CHOLHDL, VLDL, LDLCALC Lab Results  Component Value Date   TSH 1.000 03/08/2018    Therapeutic Level Labs: No results found for: LITHIUM No results found for: VALPROATE No components found for:  CBMZ  Current Medications: Current Outpatient Medications  Medication Sig Dispense Refill  . albuterol (PROVENTIL HFA;VENTOLIN HFA) 108 (90 Base) MCG/ACT inhaler Inhale into the lungs every 6 (six) hours as needed for wheezing or shortness of breath.    Marland Kitchen apixaban (ELIQUIS) 5 MG TABS tablet Take 1 tablet (5 mg total) by mouth 2 (two)  times daily. 60 tablet 6  . budesonide (PULMICORT) 180 MCG/ACT inhaler Inhale 1-2 puffs into the lungs daily.     Marland Kitchen LORazepam (ATIVAN) 1 MG tablet Take 1 tablet (1 mg total) by mouth every 8 (eight) hours as needed for anxiety. 90 tablet 1  . nebivolol (BYSTOLIC) 5 MG tablet Take 1 tablet (5 mg total) by mouth daily. 30 tablet 11  . sildenafil (VIAGRA) 50 MG tablet Take  50 mg by mouth daily as needed for erectile dysfunction.    Marland Kitchen ALPRAZolam (XANAX) 0.25 MG tablet Take 1-2 tabs (0.25mg -0.50mg ) 30-60 minutes before procedure. May repeat if needed.Do not drive. (Patient not taking: Reported on 07/02/2018) 4 tablet 0  . Probiotic Product (ALIGN) 4 MG CAPS Take 1 capsule by mouth daily.    . vardenafil (LEVITRA) 20 MG tablet Take 20 mg by mouth daily as needed for erectile dysfunction.     No current facility-administered medications for this visit.    Medication Side Effects: None  Orders placed this visit:  No orders of the defined types were placed in this encounter.   Psychiatric Specialty Exam: Review of Systems  Constitutional: Positive for weight loss.  Eyes: Positive for blurred vision.  Respiratory: Positive for shortness of breath.   Musculoskeletal: Positive for joint pain.  Neurological: Positive for tremors.  Psychiatric/Behavioral: The patient has insomnia.   diffuse nerve pain.  Blood pressure (!) 131/94, pulse 82, height 6\' 1"  (1.854 m), weight 230 lb (104.3 kg).Body mass index is 30.34 kg/m.  General Appearance: Casual  Eye Contact:  Good  Speech:  Normal Rate  Volume:  Normal  Mood:  Anxious  Affect:  Appropriate  Thought Process:  Goal Directed  Orientation:  Full (Time, Place, and Person)  Thought Content: Logical   Suicidal Thoughts:  No  Homicidal Thoughts:  No  Memory:  Recent  Judgement:  Good  Insight:  Good  Psychomotor Activity:  Normal  Concentration:  Concentration: Fair  Recall:  Good  Fund of Knowledge: Good  Language: Good  Akathisia:  No   AIMS (if indicated): not done  Assets:  Communication Skills Desire for Improvement Financial Resources/Insurance Housing Intimacy Leisure Time Resilience Social Support Talents/Skills Transportation Vocational/Educational  ADL's:  Intact  Cognition: WNL  Prognosis:  Good   Screenings:    Treatment Plan/Recommendations: Face-to-face time of 60 minutes Mr. Hanshaw has a history of panic disorder and generalized anxiety with severe symptoms in the past that have recently relapsed after several years of stability.  Greater than 50% of face to face time with patient was spent on counseling and coordination of care. We discussed in detail his predisposition to anxiety and his guilt and reluctance to take meds and his wife's opposition to it.  Explained the physical sx of anxiety ahd the neurological basis for these sx. also discussed that the added noradrenergic effect of the panic and anxiety was not good for his heart.  Discussed his wife's spiritual opposition and ways to address this.  Offered to meet with his wife to explain the physiological basis of anxiety to her also.  Discussed that this severe anxiety is affecting both his quality of life and his function at work.  He has missed work due to symptoms.  Disc in detail mechanism of action of the meds.  Disc pros/cons of his desire to take less than 50mg /D but not really for SE reasons. Start Sertraline 12.5 mg for 3 days, then 25 mg for 7 days, then 37.5 mg for 4 days then 50 mg a day Because the potential for SSRIs to worsen anxiety initially and suggested that he use Ativan 1 mg 3 times daily until the sertraline begins to work and then wean off the Ativan.  Discussed the risk of Ativan including's sedative risks and potential for falls and auto accidents.  Contact of [place] if he has any of the side effects.  Discussed the risk of cognitive side effects from  Ativan. He agrees to the plan. Follow-up 7 weeks Purnell Shoemaker, MD

## 2018-07-02 NOTE — Patient Instructions (Signed)
Sertraline 12.5 mg for 3 days, then 25 mg for 7 days, then 37.5 mg for 4 days then 50 mg a day

## 2018-08-08 ENCOUNTER — Encounter: Payer: Self-pay | Admitting: Emergency Medicine

## 2018-08-08 DIAGNOSIS — F4001 Agoraphobia with panic disorder: Secondary | ICD-10-CM | POA: Insufficient documentation

## 2018-08-10 ENCOUNTER — Encounter (HOSPITAL_COMMUNITY): Payer: Self-pay | Admitting: Nurse Practitioner

## 2018-08-10 ENCOUNTER — Other Ambulatory Visit: Payer: Self-pay

## 2018-08-10 ENCOUNTER — Ambulatory Visit (HOSPITAL_COMMUNITY)
Admission: RE | Admit: 2018-08-10 | Discharge: 2018-08-10 | Disposition: A | Discharge status: Home / Self Care | Payer: BC Managed Care – PPO | Source: Ambulatory Visit | Attending: Nurse Practitioner | Admitting: Nurse Practitioner

## 2018-08-10 VITALS — BP 140/92 | HR 86 | Ht 72.0 in | Wt 234.0 lb

## 2018-08-10 DIAGNOSIS — J45909 Unspecified asthma, uncomplicated: Secondary | ICD-10-CM | POA: Diagnosis not present

## 2018-08-10 DIAGNOSIS — Z7901 Long term (current) use of anticoagulants: Secondary | ICD-10-CM | POA: Insufficient documentation

## 2018-08-10 DIAGNOSIS — E7800 Pure hypercholesterolemia, unspecified: Secondary | ICD-10-CM | POA: Diagnosis not present

## 2018-08-10 DIAGNOSIS — Z8249 Family history of ischemic heart disease and other diseases of the circulatory system: Secondary | ICD-10-CM | POA: Diagnosis not present

## 2018-08-10 DIAGNOSIS — Z85828 Personal history of other malignant neoplasm of skin: Secondary | ICD-10-CM | POA: Diagnosis not present

## 2018-08-10 DIAGNOSIS — I4819 Other persistent atrial fibrillation: Secondary | ICD-10-CM

## 2018-08-10 DIAGNOSIS — K219 Gastro-esophageal reflux disease without esophagitis: Secondary | ICD-10-CM | POA: Diagnosis not present

## 2018-08-10 DIAGNOSIS — I10 Essential (primary) hypertension: Secondary | ICD-10-CM | POA: Insufficient documentation

## 2018-08-10 DIAGNOSIS — F419 Anxiety disorder, unspecified: Secondary | ICD-10-CM | POA: Insufficient documentation

## 2018-08-10 DIAGNOSIS — Z79899 Other long term (current) drug therapy: Secondary | ICD-10-CM | POA: Diagnosis not present

## 2018-08-10 DIAGNOSIS — N529 Male erectile dysfunction, unspecified: Secondary | ICD-10-CM | POA: Insufficient documentation

## 2018-08-10 NOTE — Progress Notes (Signed)
Pt in for EKG has noted some shortness of breath like he cannot take a deep breath since staring flecainide. Ekg shows afib at 87 bpm, qrs int 86 ms, qtc 430 ms. Pt reassured. He has not noted any shortness of breath with exertion or at night. He has EKG pending with Dr.  Rayann Heman on Thursday. He will continue 50 mg flecainide bid.

## 2018-08-10 NOTE — Progress Notes (Signed)
Primary Care Physician: Calvin Stains, MD Referring Physician: Dr. Posey Pronto Collins is a 62 y.o. male with a h/o persistent afib that is in the afib clinic for f/u. When seen last by Dr. Rayann Collins, he has some anxiety issues he was dealing with triggered by his daughter's health. He has restarted Zoloft which he thinks is helping. He felt that the flecainide was causing some symptoms. It was decided that he would stop flecainide and continue rate control. He states that he is not symptomatic with afib and thinking he will continue with rate control for now. He still has some shortness of breath at rest that he contributes to anxiety. Did not really improve off flecainide. Denies exertional dyspnea.  Today, he denies symptoms of palpitations, chest Collins, shortness of breath, orthopnea, PND, lower extremity edema, dizziness, presyncope, syncope, or neurologic sequela. The patient is tolerating medications without difficulties and is otherwise without complaint today.   Past Medical History:  Diagnosis Date  . Anxiety   . Asthmatic bronchitis   . ED (erectile dysfunction)   . GERD (gastroesophageal reflux disease)   . History of colon polyps   . Hypercholesteremia   . Hypertension   . Obesity   . Persistent atrial fibrillation 02/02/2018  . Squamous cell carcinoma of skin   . SVT (supraventricular tachycardia) (HCC)    Past Surgical History:  Procedure Laterality Date  . CARDIOVERSION N/A 03/02/2018   Procedure: CARDIOVERSION;  Surgeon: Calvin Pain, MD;  Location: [facility] ENDOSCOPY;  Service: Cardiovascular;  Laterality: N/A;  . left arm surgery    . skin cancer removed    . WISDOM TOOTH EXTRACTION      Current Outpatient Medications  Medication Sig Dispense Refill  . albuterol (PROVENTIL HFA;VENTOLIN HFA) 108 (90 Base) MCG/ACT inhaler Inhale into the lungs every 6 (six) hours as needed for wheezing or shortness of breath.    . ALPRAZolam (XANAX) 0.25 MG tablet Take 1-2 tabs  (0.25mg -0.50mg ) 30-60 minutes before procedure. May repeat if needed.Do not drive. 4 tablet 0  . apixaban (ELIQUIS) 5 MG TABS tablet Take 1 tablet (5 mg total) by mouth 2 (two) times daily. 60 tablet 6  . budesonide (PULMICORT) 180 MCG/ACT inhaler Inhale 1-2 puffs into the lungs daily.     Marland Kitchen LORazepam (ATIVAN) 1 MG tablet Take 1 tablet (1 mg total) by mouth every 8 (eight) hours as needed for anxiety. 90 tablet 1  . nebivolol (BYSTOLIC) 5 MG tablet Take 1 tablet (5 mg total) by mouth daily. 30 tablet 11  . sertraline (ZOLOFT) 50 MG tablet Take 50 mg by mouth daily.    . sildenafil (VIAGRA) 50 MG tablet Take 50 mg by mouth daily as needed for erectile dysfunction.    . vardenafil (LEVITRA) 20 MG tablet Take 20 mg by mouth daily as needed for erectile dysfunction.     No current facility-administered medications for this encounter.     Allergies  Allergen Reactions  . Paroxetine     60 lb weight gain    Social History   Socioeconomic History  . Marital status: Married    Spouse name: Not on file  . Number of children: 4  . Years of education: Not on file  . Highest education level: Not on file  Occupational History  . Occupation: Secretary/administrator Professor  Social Needs  . Financial resource strain: Not on file  . Food insecurity:    Worry: Not on file    Inability: Not on file  .  Transportation needs:    Medical: Not on file    Non-medical: Not on file  Tobacco Use  . Smoking status: Never Smoker  . Smokeless tobacco: Never Used  Substance and Sexual Activity  . Alcohol use: Not Currently  . Drug use: No  . Sexual activity: Not on file  Lifestyle  . Physical activity:    Days per week: Not on file    Minutes per session: Not on file  . Stress: Not on file  Relationships  . Social connections:    Talks on phone: Not on file    Gets together: Not on file    Attends religious service: Not on file    Active member of club or organization: Not on file    Attends meetings of  clubs or organizations: Not on file    Relationship status: Not on file  . Intimate partner violence:    Fear of current or ex partner: Not on file    Emotionally abused: Not on file    Physically abused: Not on file    Forced sexual activity: Not on file  Other Topics Concern  . Not on file  Social History Narrative   Lives in [place].  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  Attends [employer]    Family History  Problem Relation Age of Onset  . Emphysema Father   . Hypertension Father   . Emphysema Paternal Uncle   . Other Daughter        liver transplant  . Hypertension Mother   . Stroke Mother   . Cancer Brother   . Skin cancer Brother        basal cell  . Skin cancer Sister        basal cell    ROS- All systems are reviewed and negative except as per the HPI above  Physical Exam: Vitals:   08/10/18 1023  BP: (!) 140/92  Pulse: 86  Weight: 106.1 kg  Height: 6' (1.829 m)   Wt Readings from Last 3 Encounters:  08/10/18 106.1 kg  06/28/18 104.9 kg  06/17/18 64.5 kg    Labs: Lab Results  Component Value Date   NA 140 02/25/2018   K 4.4 02/25/2018   CL 111 02/25/2018   CO2 23 02/25/2018   GLUCOSE 104 (H) 02/25/2018   BUN 9 02/25/2018   CREATININE 1.02 02/25/2018   CALCIUM 9.6 02/25/2018   No results found for: INR No results found for: CHOL, HDL, LDLCALC, TRIG   GEN- The patient is well appearing, alert and oriented x 3 today.   Head- normocephalic, atraumatic Eyes-  Sclera clear, conjunctiva pink Ears- hearing intact Oropharynx- clear Neck- supple, no JVP Lymph- no cervical lymphadenopathy Lungs- Clear to ausculation bilaterally, normal work of breathing Heart-irregular rate and rhythm, no murmurs, rubs or gallops, PMI not laterally displaced GI- soft, NT, ND, + BS Extremities- no clubbing, cyanosis, or edema MS- no significant deformity or atrophy Skin- no rash or lesion Psych- euthymic mood, full affect Neuro- strength  and sensation are intact  EKG- afib at 86 bpm, qrs int 82 ms, qtc 409 ms    Assessment and Plan: 1. Persistent afib Now off flecainide used just a short period of time, not long enough to try cardioversion to restore SR At the time was having significant anxiety for his daughter's health and thought flecainide was contributing to some symptoms He wants to continue with rate control afib for now . Continue bystolic  without change, good rate control Continue eliquis  5 mg bid   F/u with Dr. Rayann Collins in 3 months  Geroge Baseman. Calvin Collins, [facility] [address] [place],  [phone]

## 2018-08-20 ENCOUNTER — Encounter: Payer: Self-pay | Admitting: Psychiatry

## 2018-08-20 ENCOUNTER — Ambulatory Visit (INDEPENDENT_AMBULATORY_CARE_PROVIDER_SITE_OTHER): Payer: BC Managed Care – PPO | Admitting: Psychiatry

## 2018-08-20 DIAGNOSIS — F411 Generalized anxiety disorder: Secondary | ICD-10-CM | POA: Diagnosis not present

## 2018-08-20 DIAGNOSIS — F4001 Agoraphobia with panic disorder: Secondary | ICD-10-CM

## 2018-08-20 NOTE — Progress Notes (Signed)
Calvin Collins [id number] [birth year] 62 y.o.  Subjective:   Patient ID:  Calvin Collins is a 62 y.o. (DOB Mar 23, 1956) male.  Chief Complaint:  Chief Complaint  Patient presents with  . Follow-up    Medication mangement    HPI Calvin Collins presents to the office today for follow-up of anxiety.  Been on 50mg  sertraline for 2--3 weeks.  Much much better re: anxiety.  Dramatic, well worth it.  Created stability.  Never sleeps well but Zoloft seems to make it worse.  Not real concerned.  Initial and terminal.  Chronically poor sleeeper.  Mind is always active and struggle going to sleep.  Stress with his and his D's health.  Some EMA, restless, naps at times.  Function ok daytime.  Some balance of emotion to a fault.  Used Ativan initially and helpful.  Molly negative bx for Lymphoma recently.  She still has infections for 3 years.  Card doubled Bystolic since here.  Review of Systems:  Review of Systems  Constitutional: Positive for fatigue.  Respiratory:       SOB from last visit is still there but he thinks it's maybe still anxiety related.  Neurological: Negative for tremors and weakness.  Psychiatric/Behavioral: Positive for sleep disturbance. Negative for agitation, behavioral problems, confusion, decreased concentration, dysphoric mood, hallucinations, self-injury and suicidal ideas. The patient is nervous/anxious. The patient is not hyperactive.     Medications: I have reviewed the patient's current medications.  Current Outpatient Medications  Medication Sig Dispense Refill  . albuterol (PROVENTIL HFA;VENTOLIN HFA) 108 (90 Base) MCG/ACT inhaler Inhale into the lungs every 6 (six) hours as needed for wheezing or shortness of breath.    . ALPRAZolam (XANAX) 0.25 MG tablet Take 1-2 tabs (0.25mg -0.50mg ) 30-60 minutes before procedure. May repeat if needed.Do not drive. 4 tablet 0  . apixaban (ELIQUIS) 5 MG TABS tablet Take 1 tablet (5 mg total) by mouth 2 (two) times daily. 60  tablet 6  . budesonide (PULMICORT) 180 MCG/ACT inhaler Inhale 1-2 puffs into the lungs daily.     Marland Kitchen LORazepam (ATIVAN) 1 MG tablet Take 1 tablet (1 mg total) by mouth every 8 (eight) hours as needed for anxiety. 90 tablet 1  . nebivolol (BYSTOLIC) 5 MG tablet Take 1 tablet (5 mg total) by mouth daily. 30 tablet 11  . sertraline (ZOLOFT) 50 MG tablet Take 50 mg by mouth daily.    . sildenafil (VIAGRA) 50 MG tablet Take 50 mg by mouth daily as needed for erectile dysfunction.    . vardenafil (LEVITRA) 20 MG tablet Take 20 mg by mouth daily as needed for erectile dysfunction.     No current facility-administered medications for this visit.     Medication Side Effects: Other: some lightheaded manageable, HA resolved, don't sleep well., less energy  Allergies: No Known Allergies  Past Medical History:  Diagnosis Date  . Anxiety   . Asthmatic bronchitis   . ED (erectile dysfunction)   . GERD (gastroesophageal reflux disease)   . History of colon polyps   . Hypercholesteremia   . Hypertension   . Obesity   . Persistent atrial fibrillation 02/02/2018  . Squamous cell carcinoma of skin   . SVT (supraventricular tachycardia) (HCC)     Family History  Problem Relation Age of Onset  . Emphysema Father   . Hypertension Father   . Emphysema Paternal Uncle   . Other Daughter        liver transplant  . Hypertension Mother   .  Stroke Mother   . Cancer Brother   . Skin cancer Brother        basal cell  . Skin cancer Sister        basal cell    Social History   Socioeconomic History  . Marital status: Married    Spouse name: Not on file  . Number of children: 4  . Years of education: Not on file  . Highest education level: Not on file  Occupational History  . Occupation: Secretary/administrator Professor  Social Needs  . Financial resource strain: Not on file  . Food insecurity:    Worry: Not on file    Inability: Not on file  . Transportation needs:    Medical: Not on file     Non-medical: Not on file  Tobacco Use  . Smoking status: Never Smoker  . Smokeless tobacco: Never Used  Substance and Sexual Activity  . Alcohol use: Not Currently  . Drug use: No  . Sexual activity: Not on file  Lifestyle  . Physical activity:    Days per week: Not on file    Minutes per session: Not on file  . Stress: Not on file  Relationships  . Social connections:    Talks on phone: Not on file    Gets together: Not on file    Attends religious service: Not on file    Active member of club or organization: Not on file    Attends meetings of clubs or organizations: Not on file    Relationship status: Not on file  . Intimate partner violence:    Fear of current or ex partner: Not on file    Emotionally abused: Not on file    Physically abused: Not on file    Forced sexual activity: Not on file  Other Topics Concern  . Not on file  Social History Narrative   Lives in [place].  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  Attends [employer]    Past Medical History, Surgical history, Social history, and Family history were reviewed and updated as appropriate.   Please see review of systems for further details on the patient's review from today.   Objective:   Physical Exam:  There were no vitals taken for this visit.  Physical Exam Constitutional:      General: He is not in acute distress.    Appearance: He is well-developed.  Musculoskeletal:        General: No deformity.  Neurological:     Mental Status: He is alert and oriented to person, place, and time.     Motor: No tremor.     Coordination: Coordination normal.     Gait: Gait abnormal.  Psychiatric:        Attention and Perception: Attention normal.        Mood and Affect: Mood is anxious. Mood is not depressed. Affect is not labile, blunt, angry or inappropriate.        Speech: Speech normal.        Behavior: Behavior normal.        Thought Content: Thought content normal. Thought  content does not include homicidal or suicidal ideation. Thought content does not include homicidal or suicidal plan.        Cognition and Memory: Cognition normal.        Judgment: Judgment normal.     Comments: Insight intact. No auditory or visual hallucinations. No delusions.      Lab Review:  Component Value Date/Time   NA 140 02/25/2018 1056   K 4.4 02/25/2018 1056   CL 111 02/25/2018 1056   CO2 23 02/25/2018 1056   GLUCOSE 104 (H) 02/25/2018 1056   BUN 9 02/25/2018 1056   CREATININE 1.02 02/25/2018 1056   CALCIUM 9.6 02/25/2018 1056   PROT 7.0 03/08/2018 0951   GFRNONAA >60 02/25/2018 1056   GFRAA >60 02/25/2018 1056       Component Value Date/Time   WBC 5.6 02/25/2018 1056   RBC 4.86 02/25/2018 1056   HGB 15.9 02/25/2018 1056   HCT 46.0 02/25/2018 1056   PLT 228 02/25/2018 1056   MCV 94.7 02/25/2018 1056   MCH 32.7 02/25/2018 1056   MCHC 34.6 02/25/2018 1056   RDW 11.8 02/25/2018 1056    No results found for: POCLITH, LITHIUM   No results found for: PHENYTOIN, PHENOBARB, VALPROATE, CBMZ   .res Assessment: Plan:    Panic disorder with agoraphobia  Generalized anxiety disorder   Answered questions about sx of anxiety including tremor in the am and mornings were the worst.  Significant improvement and will expect even more benefit.  Continue sertraline at 50mg  longer.  Still ambivalent about being on the meds.  Once better for a while will reduce the dosage to prevent or reduce the blunting to 37.5mg  daily.  PCP rx Zoloft.  Disc SE in detail.  Lorazepam prn.  We discussed the short-term risks associated with benzodiazepines including sedation and increased fall risk among others.  Discussed long-term side effect risk including dependence, potential withdrawal symptoms, and the potential eventual dose-related risk of dementia.  Supportive therapy on dealing with D's stress.    This appointment was 25 mins  FU 2 mos  Lynder Parents, MD, DFAPA  Please  see After Visit Summary for patient specific instructions.  No future appointments.  No orders of the defined types were placed in this encounter.     -------------------------------

## 2018-10-21 ENCOUNTER — Other Ambulatory Visit (HOSPITAL_COMMUNITY): Payer: Self-pay | Admitting: Nurse Practitioner

## 2018-11-19 ENCOUNTER — Ambulatory Visit: Payer: BC Managed Care – PPO | Admitting: Psychiatry

## 2018-11-23 ENCOUNTER — Telehealth: Payer: Self-pay | Admitting: Psychiatry

## 2018-11-23 ENCOUNTER — Telehealth: Payer: Self-pay

## 2018-11-23 ENCOUNTER — Other Ambulatory Visit: Payer: Self-pay | Admitting: Psychiatry

## 2018-11-23 MED ORDER — SERTRALINE HCL 50 MG PO TABS: 50.0000 mg | ORAL_TABLET | Freq: Every day | ORAL | 1 refills | Status: DC

## 2018-11-23 NOTE — Telephone Encounter (Signed)
Call placed to Pt.  Advised at this time-routine follow up appointments are being cancelled to reduce possible exposure for our patients.  Per Pt-feels fine.  Has been doing well.  Planned to call us to cancel d/t Pt has immunocompromised family member.  Advised appointment would be cancelled and Pt would be contacted to reschedule.  Advised to call office if any needs.

## 2018-11-23 NOTE — Telephone Encounter (Signed)
Done

## 2018-11-23 NOTE — Telephone Encounter (Signed)
Patient called and said that his pcp  Wants you to take over prescribing the zoloft. He takes 50 mg 1 x day . Please escribe this medicine to [facility] on [address]. His next appointment is April 3.

## 2018-11-24 ENCOUNTER — Ambulatory Visit: Payer: BC Managed Care – PPO | Admitting: Internal Medicine

## 2018-12-10 ENCOUNTER — Ambulatory Visit: Payer: BC Managed Care – PPO | Admitting: Psychiatry

## 2018-12-30 ENCOUNTER — Telehealth: Payer: Self-pay | Admitting: Internal Medicine

## 2018-12-30 NOTE — Telephone Encounter (Signed)
New message    [facility] for pt to call and schedule appt from recall. Pt had OV scheduled in march and it was canceled d/t corona. Will offer Mychart Video visit to pt on 04.27.20.

## 2019-01-01 ENCOUNTER — Other Ambulatory Visit: Payer: Self-pay | Admitting: Psychiatry

## 2019-01-01 DIAGNOSIS — F4001 Agoraphobia with panic disorder: Secondary | ICD-10-CM

## 2019-05-11 ENCOUNTER — Other Ambulatory Visit (HOSPITAL_COMMUNITY): Payer: Self-pay | Admitting: Nurse Practitioner

## 2019-05-24 ENCOUNTER — Ambulatory Visit: Payer: BC Managed Care – PPO | Admitting: Nurse Practitioner

## 2019-05-31 ENCOUNTER — Telehealth: Payer: Self-pay | Admitting: Internal Medicine

## 2019-05-31 NOTE — Telephone Encounter (Signed)
New message    Pt declined virtual visit. He states that it is silly and he can not get an EKG and things that way. I offered to schedule pt with APP but pt declined. He said he wanted to see the doctor. I told him I was unsure of when that would be, but I would enter a recall and we would f/u next month. Pt states that he just picked up his last refill of his medications and would need them in a few weeks. I told him when he called to schedule he may be told he needs to schedule an appt but I would let the RN know he declined virtual visit and would wait until he could be seen in the office by the doctor.

## 2019-06-01 ENCOUNTER — Ambulatory Visit: Payer: BC Managed Care – PPO | Admitting: Internal Medicine

## 2019-06-06 ENCOUNTER — Other Ambulatory Visit: Payer: Self-pay

## 2019-06-06 ENCOUNTER — Ambulatory Visit: Payer: BC Managed Care – PPO | Admitting: Internal Medicine

## 2019-06-06 ENCOUNTER — Encounter: Payer: Self-pay | Admitting: Internal Medicine

## 2019-06-06 VITALS — BP 134/88 | HR 84 | Ht 72.0 in | Wt 258.0 lb

## 2019-06-06 DIAGNOSIS — I4819 Other persistent atrial fibrillation: Secondary | ICD-10-CM

## 2019-06-06 MED ORDER — NEBIVOLOL HCL 5 MG PO TABS: 5.0000 mg | ORAL_TABLET | Freq: Every day | ORAL | 3 refills | Status: DC

## 2019-06-06 MED ORDER — APIXABAN 5 MG PO TABS: 5.0000 mg | ORAL_TABLET | Freq: Two times a day (BID) | ORAL | 3 refills | Status: DC

## 2019-06-06 NOTE — Patient Instructions (Addendum)
Medication Instructions:  Your physician recommends that you continue on your current medications as directed. Please refer to the Current Medication list given to you today.  Labwork: None ordered.  Testing/Procedures: None ordered.  Follow-Up: Your physician recommends that you schedule a follow-up appointment in:   6 months with Adline Peals, PA at the [facility]-- December 05, 2019 @ 3PM  12 months with Dr. Rayann Heman  Any Other Special Instructions Will Be Listed Below (If Applicable).     If you need a refill on your cardiac medications before your next appointment, please call your pharmacy.

## 2019-06-06 NOTE — Progress Notes (Signed)
PCP: Harlan Stains, MD Primary EP: Dr Rayann Heman  Dart Calvin Collins is a 63 y.o. male who presents today for routine electrophysiology followup.  Since last being seen in our clinic, the patient reports doing very well.  He is asymptomatic with his afib.  He has rare SOB at rest.  He is active, without limitation. Today, he denies symptoms of palpitations, chest pain, exertional shortness of breath,  lower extremity edema, dizziness, presyncope, or syncope.  The patient is otherwise without complaint today.   Past Medical History:  Diagnosis Date  . Anxiety   . Asthmatic bronchitis   . ED (erectile dysfunction)   . GERD (gastroesophageal reflux disease)   . History of colon polyps   . Hypercholesteremia   . Hypertension   . Obesity   . Persistent atrial fibrillation 02/02/2018  . Squamous cell carcinoma of skin   . SVT (supraventricular tachycardia) (HCC)    Past Surgical History:  Procedure Laterality Date  . CARDIOVERSION N/A 03/02/2018   Procedure: CARDIOVERSION;  Surgeon: Jerline Pain, MD;  [facility]: [facility] ENDOSCOPY;  Service: Cardiovascular;  Laterality: N/A;  . left arm surgery    . skin cancer removed    . WISDOM TOOTH EXTRACTION      ROS- all systems are reviewed and negatives except as per HPI above  Current Outpatient Medications  Medication Sig Dispense Refill  . albuterol (PROVENTIL HFA;VENTOLIN HFA) 108 (90 Base) MCG/ACT inhaler Inhale into the lungs every 6 (six) hours as needed for wheezing or shortness of breath.    . ALPRAZolam (XANAX) 0.25 MG tablet Take 1-2 tabs (0.25mg -0.50mg ) 30-60 minutes before procedure. May repeat if needed.Do not drive. 4 tablet 0  . apixaban (ELIQUIS) 5 MG TABS tablet Take 1 tablet (5 mg total) by mouth 2 (two) times daily. Appointment Required For Further Refills 60 tablet 1  . budesonide (PULMICORT) 180 MCG/ACT inhaler Inhale 1-2 puffs into the lungs daily.     Marland Kitchen LORazepam (ATIVAN) 1 MG tablet TAKE 1 TABLET(1 MG) BY MOUTH EVERY 8 HOURS AS  NEEDED FOR ANXIETY 90 tablet 0  . nebivolol (BYSTOLIC) 5 MG tablet Take 1 tablet (5 mg total) by mouth daily. 30 tablet 11  . sertraline (ZOLOFT) 25 MG tablet Take 25 mg by mouth daily.    . sildenafil (VIAGRA) 50 MG tablet Take 50 mg by mouth daily as needed for erectile dysfunction.    . vardenafil (LEVITRA) 20 MG tablet Take 20 mg by mouth daily as needed for erectile dysfunction.     No current facility-administered medications for this visit.     Physical Exam: Vitals:   06/06/19 1609  BP: 134/88  Pulse: 84  SpO2: 98%  Weight: 258 lb (117 kg)  Height: 6' (1.829 m)    GEN- The patient is well appearing, alert and oriented x 3 today.   Head- normocephalic, atraumatic Eyes-  Sclera clear, conjunctiva pink Ears- hearing intact Oropharynx- clear Lungs-  normal work of breathing Heart- irregular rate and rhythm  GI- soft  Extremities- no clubbing, cyanosis, or edema  Wt Readings from Last 3 Encounters:  06/06/19 258 lb (117 kg)  08/10/18 234 lb (106.1 kg)  06/28/18 231 lb 3.2 oz (104.9 kg)    EKG tracing ordered today is personally reviewed and shows afib  Assessment and Plan:  1. Longstanding persistent afib We discussed options at length today.  Given paucity of symptoms, he would prefer rate control long term. His chads2vasc score is 0-1 (? htn).  We discussed  possibly stopping anticoagulation.  He is clear that he would prefer to continue anticoagulation going forward.  Follow-up in AF clinic in 6 months I will see in a year  Thompson Grayer MD, [facility] 06/06/2019 4:28 PM

## 2019-09-03 ENCOUNTER — Other Ambulatory Visit: Payer: Self-pay | Admitting: Psychiatry

## 2019-09-07 NOTE — Telephone Encounter (Signed)
Have patient schedule apt, last visit 08/2018

## 2019-09-08 NOTE — Telephone Encounter (Signed)
Left a voicemail for patient to call and schedule an appointment. ° ° ° °

## 2019-12-05 ENCOUNTER — Ambulatory Visit (HOSPITAL_COMMUNITY): Payer: BC Managed Care – PPO | Admitting: Physician Assistant

## 2019-12-25 IMAGING — CT CT ABDOMEN W/ CM
2 of 5 series · 15 of 46 positions shown, 17 images · IV contrast (iopamidol)
Comparison: 02/24/2017

CLINICAL DATA: Severe right upper quadrant abdominal pain for 2
months. 20 lb weight loss over 2 months.

EXAM:
CT ABDOMEN WITH CONTRAST
TECHNIQUE: Multidetector CT imaging of the abdomen was performed using the
standard protocol following bolus administration of intravenous
contrast.
CONTRAST:  125mL [id number] IOPAMIDOL ([id number]) INJECTION 61%

[Series 2: abd pelvis 5.00 br40 s3 ax · axial · 0.80mm/px · z∈[+1274,+1504]mm · 12 of 54 slices shown, 14 images]
[im 4/54  soft-tissue]
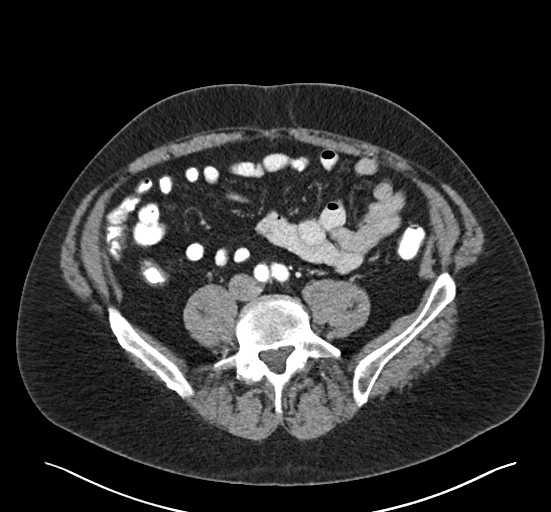
[im 4/54  bone]
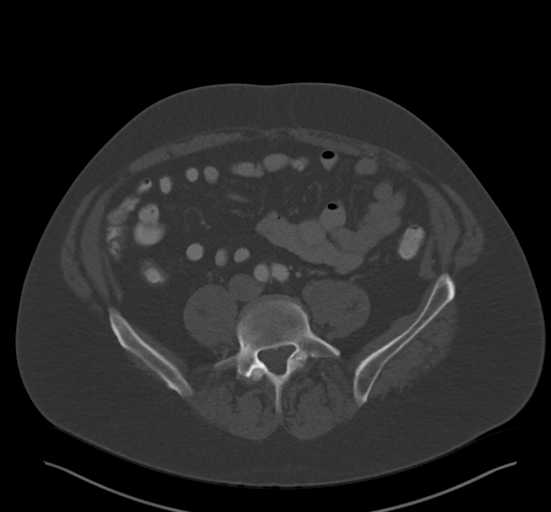
[im 8/54  soft-tissue]
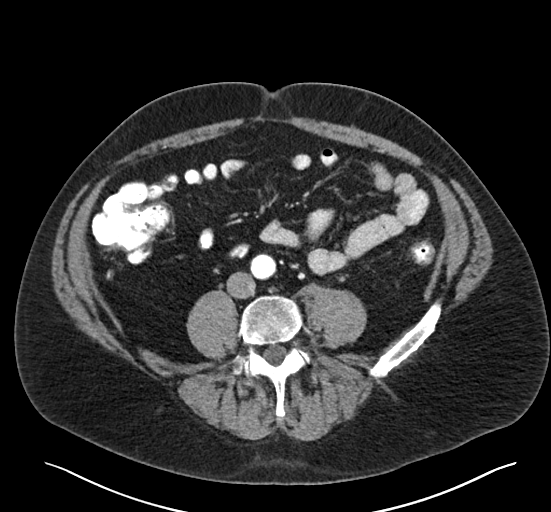
[im 11/54  soft-tissue]
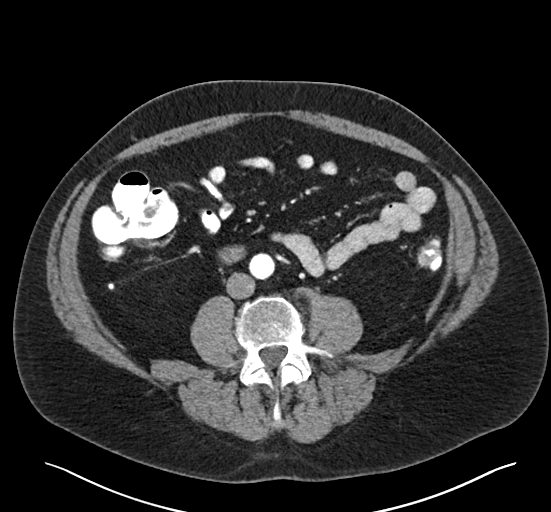
[im 18/54  soft-tissue]
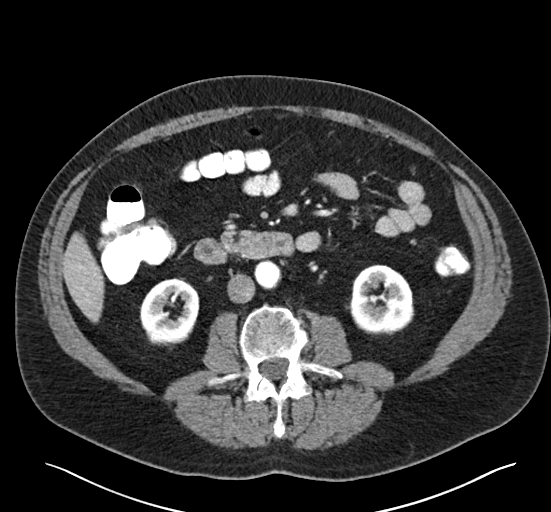
[im 22/54  soft-tissue]
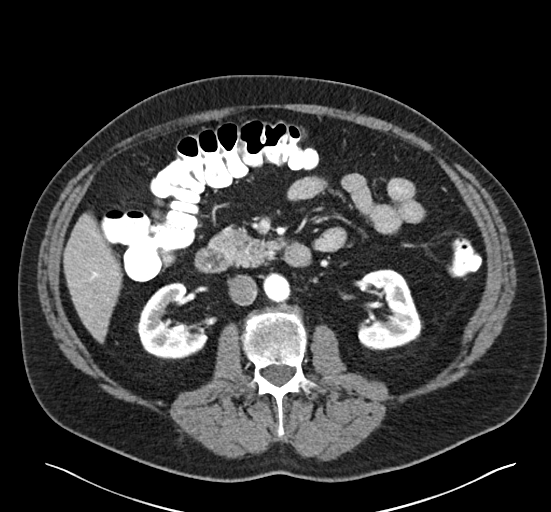
[im 25/54  soft-tissue]
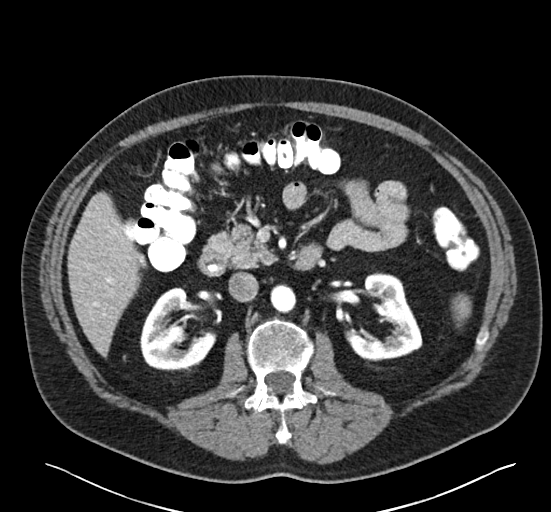
[im 29/54  soft-tissue]
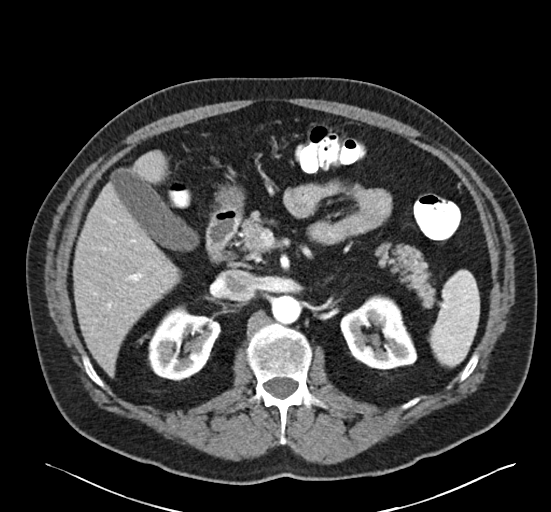
[im 32/54  soft-tissue]
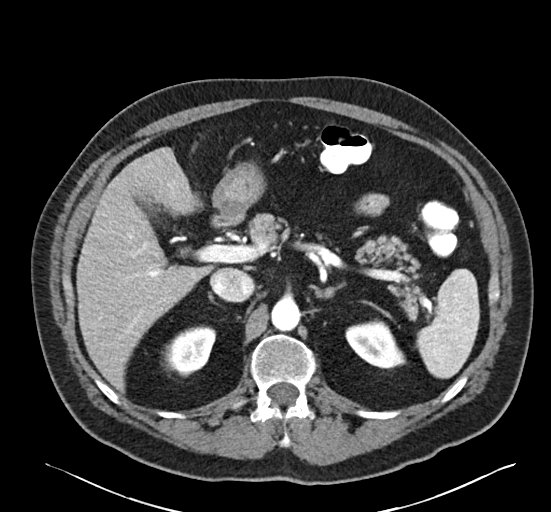
[im 36/54  soft-tissue]
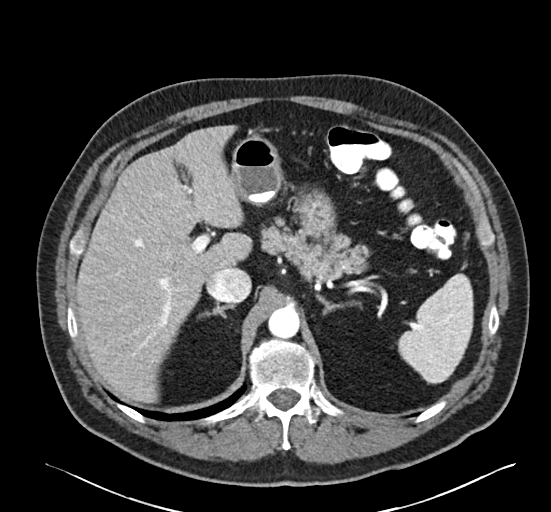
[im 36/54  bone]
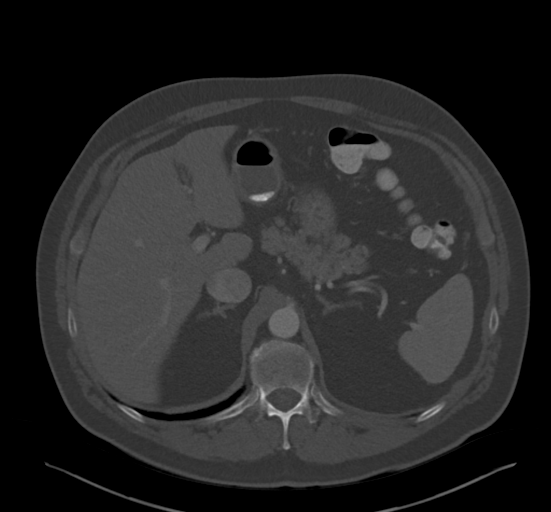
[im 43/54  soft-tissue]
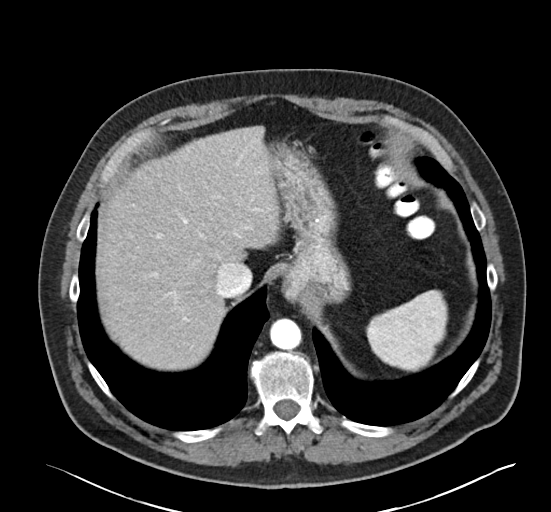
[im 46/54  soft-tissue]
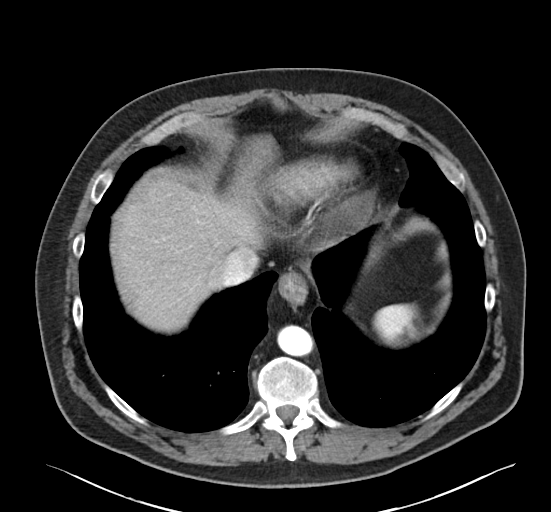
[im 50/54  soft-tissue]
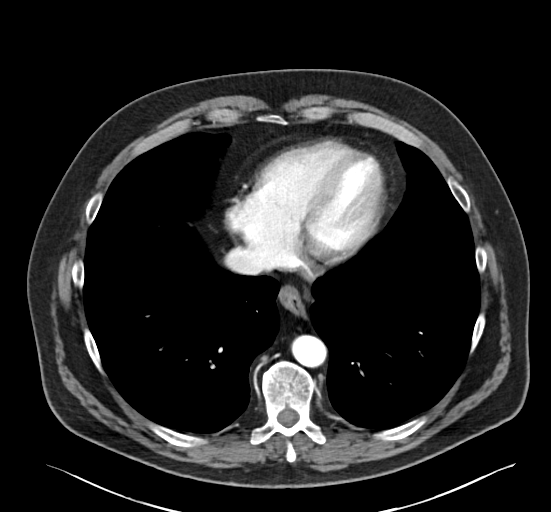

[Series 6: abd pelvis 2.00 br40 s3 cor · coronal · 0.52mm/px · 3 of 193 slices shown]
[im 65/193  soft-tissue]
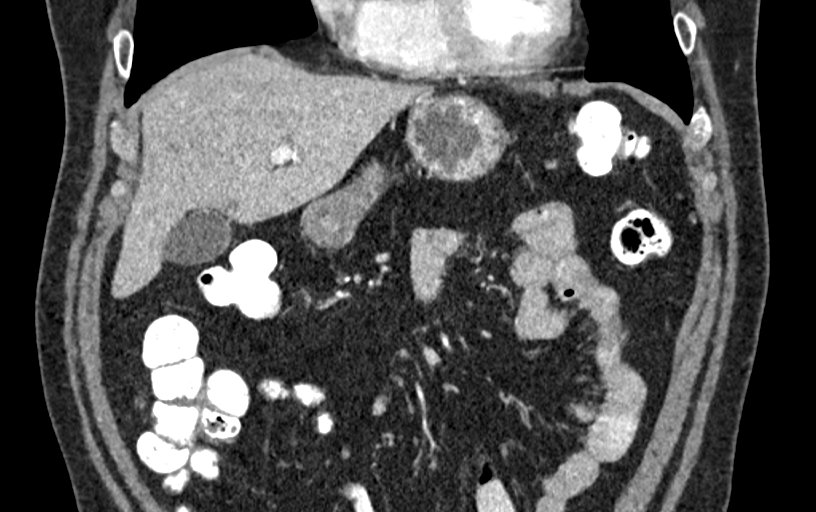
[im 86/193  soft-tissue]
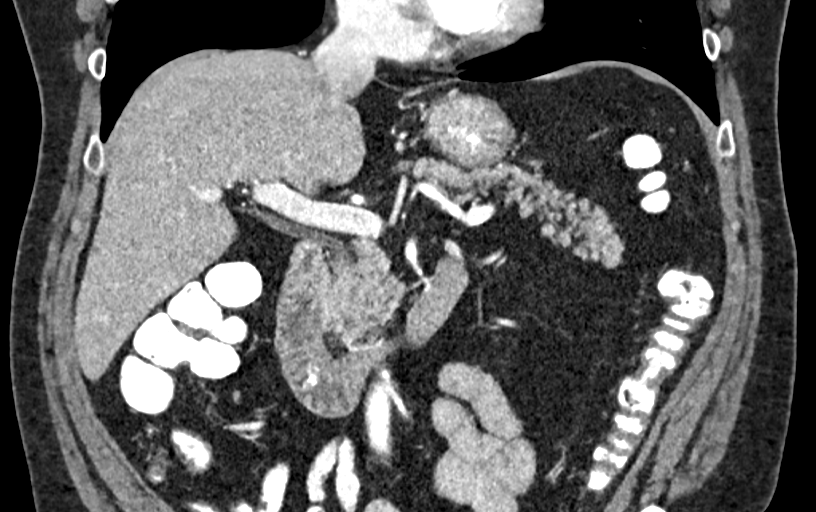
[im 107/193  soft-tissue]
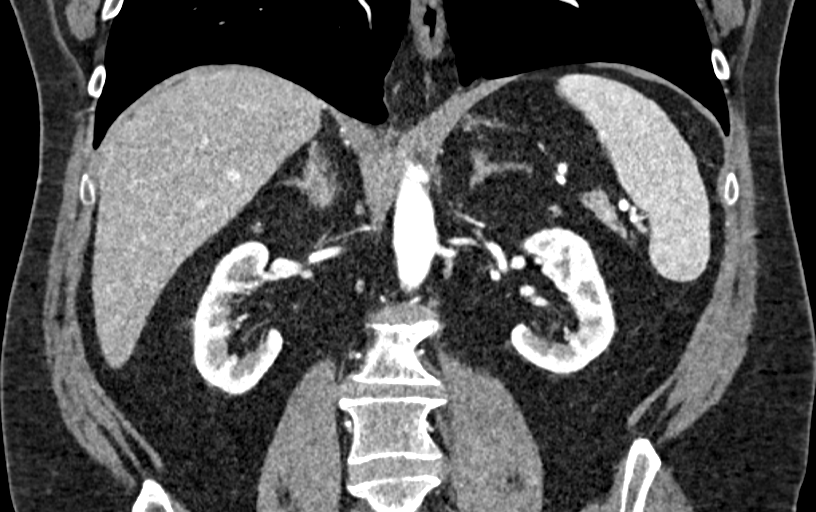

[15 of 46 positions shown; findings below may reference images not displayed]

FINDINGS: Lower chest: No acute findings.

Hepatobiliary: No hepatic masses identified. Stable tiny sub-cm
low-attenuation lesion in the left hepatic lobe, consistent with
benign etiology. Gallbladder is unremarkable. No evidence of biliary
ductal dilatation.

Pancreas:  No mass or inflammatory changes.

Spleen:  Within normal limits in size and appearance.

Adrenals/Urinary Tract: No masses identified. No evidence of
hydronephrosis.

Stomach/Bowel: Visualized portion unremarkable.

Vascular/Lymphatic: No pathologically enlarged lymph nodes
identified. No abdominal aortic aneurysm. Aortic atherosclerosis.

Other:  None.

Musculoskeletal:  No suspicious bone lesions identified.
IMPRESSION: No acute findings or other significant abnormality.

## 2020-04-17 ENCOUNTER — Ambulatory Visit: Payer: BC Managed Care – PPO | Admitting: Internal Medicine

## 2020-04-17 ENCOUNTER — Other Ambulatory Visit: Payer: Self-pay

## 2020-04-17 ENCOUNTER — Encounter: Payer: Self-pay | Admitting: Internal Medicine

## 2020-04-17 VITALS — BP 138/86 | HR 86 | Ht 72.0 in | Wt 267.0 lb

## 2020-04-17 DIAGNOSIS — R0600 Dyspnea, unspecified: Secondary | ICD-10-CM

## 2020-04-17 DIAGNOSIS — I4811 Longstanding persistent atrial fibrillation: Secondary | ICD-10-CM | POA: Diagnosis not present

## 2020-04-17 DIAGNOSIS — I4891 Unspecified atrial fibrillation: Secondary | ICD-10-CM | POA: Insufficient documentation

## 2020-04-17 MED ORDER — NEBIVOLOL HCL 5 MG PO TABS: 5.0000 mg | ORAL_TABLET | Freq: Every day | ORAL | 3 refills | Status: DC

## 2020-04-17 MED ORDER — APIXABAN 5 MG PO TABS: 5.0000 mg | ORAL_TABLET | Freq: Two times a day (BID) | ORAL | 3 refills | Status: DC

## 2020-04-17 NOTE — Progress Notes (Signed)
PCP: Harlan Stains, MD   Primary EP: Dr Rayann Heman  Calvin Collins is a 64 y.o. male who presents today for routine electrophysiology followup.  Since last being seen in our clinic, the patient reports doing very well. He is active.  Mostly unaware of his afib.  He has SOB with moderate activity which he thinks is stable and due to bystolic.  Today, he denies symptoms of palpitations, chest pain,   lower extremity edema, dizziness, presyncope, or syncope.  The patient is otherwise without complaint today.   Past Medical History:  Diagnosis Date  . Anxiety   . Asthmatic bronchitis   . ED (erectile dysfunction)   . GERD (gastroesophageal reflux disease)   . History of colon polyps   . Hypercholesteremia   . Hypertension   . Obesity   . Persistent atrial fibrillation ([place]) 02/02/2018  . Squamous cell carcinoma of skin   . SVT (supraventricular tachycardia) (HCC)    Past Surgical History:  Procedure Laterality Date  . CARDIOVERSION N/A 03/02/2018   Procedure: CARDIOVERSION;  Surgeon: Jerline Pain, MD;  Location: [facility] ENDOSCOPY;  Service: Cardiovascular;  Laterality: N/A;  . left arm surgery    . skin cancer removed    . WISDOM TOOTH EXTRACTION      ROS- all systems are reviewed and negatives except as per HPI above  Current Outpatient Medications  Medication Sig Dispense Refill  . albuterol (PROVENTIL HFA;VENTOLIN HFA) 108 (90 Base) MCG/ACT inhaler Inhale into the lungs every 6 (six) hours as needed for wheezing or shortness of breath.    . ALPRAZolam (XANAX) 0.25 MG tablet Take 1-2 tabs (0.25mg -0.50mg ) 30-60 minutes before procedure. May repeat if needed.Do not drive. 4 tablet 0  . apixaban (ELIQUIS) 5 MG TABS tablet Take 1 tablet (5 mg total) by mouth 2 (two) times daily. Appointment Required For Further Refills 180 tablet 3  . budesonide (PULMICORT) 180 MCG/ACT inhaler Inhale 1-2 puffs into the lungs daily.     Marland Kitchen LORazepam (ATIVAN) 1 MG tablet TAKE 1 TABLET(1 MG) BY MOUTH EVERY 8  HOURS AS NEEDED FOR ANXIETY 90 tablet 0  . nebivolol (BYSTOLIC) 5 MG tablet Take 1 tablet (5 mg total) by mouth daily. 90 tablet 3  . sertraline (ZOLOFT) 25 MG tablet Take 25 mg by mouth daily.     No current facility-administered medications for this visit.    Physical Exam: Vitals:   04/17/20 0939  BP: 138/86  Pulse: 86  SpO2: 98%  Weight: 267 lb (121.1 kg)  Height: 6' (1.829 m)    GEN- The patient is well appearing, alert and oriented x 3 today.   Head- normocephalic, atraumatic Eyes-  Sclera clear, conjunctiva pink Ears- hearing intact Oropharynx- clear Lungs-  normal work of breathing Heart- irregular rate and rhythm  GI- soft  Extremities- no clubbing, cyanosis, or edema  Wt Readings from Last 3 Encounters:  04/17/20 267 lb (121.1 kg)  06/06/19 258 lb (117 kg)  08/10/18 234 lb (106.1 kg)    EKG tracing ordered today is personally reviewed and shows afib  Assessment and Plan:  1. Permanent afib Rate controlled Asymptomatic chads2vasc score is 1 (HTN).  We discussed pros and cons to anticoagulation at length today.  He had recent fall with L knee hematoma but is clear that he would like to continue anticoagulation at this time.   Update echo to evaluate for structural changes related to afib  2. SOB Stable Update echo If SOB worsens or ischemic symptoms develop  then further CV risk stratification would be needed.  He is aware to let me know if his symptoms change  Risks, benefits and potential toxicities for medications prescribed and/or refilled reviewed with patient today.   Return in a year to see EP APP  Thompson Grayer MD, [facility] 04/17/2020 10:04 AM

## 2020-04-17 NOTE — Patient Instructions (Signed)
Medication Instructions:  Your physician recommends that you continue on your current medications as directed. Please refer to the Current Medication list given to you today.  *If you need a refill on your cardiac medications before your next appointment, please call your pharmacy*   Lab Work: None ordered.  If you have labs (blood work) drawn today and your tests are completely normal, you will receive your results only by: Marland Kitchen MyChart Message (if you have MyChart) OR . A paper copy in the mail If you have any lab test that is abnormal or we need to change your treatment, we will call you to review the results.   Testing/Procedures: Your physician has requested that you have an echocardiogram. Echocardiography is a painless test that uses sound waves to create images of your heart. It provides your doctor with information about the size and shape of your heart and how well your heart's chambers and valves are working. This procedure takes approximately one hour. There are no restrictions for this procedure.    Follow-Up: At [facility], you and your health needs are our priority.  As part of our continuing mission to provide you with exceptional heart care, we have created designated Provider Care Teams.  These Care Teams include your primary Cardiologist (physician) and Advanced Practice Providers (APPs -  Physician Assistants and Nurse Practitioners) who all work together to provide you with the care you need, when you need it.  We recommend signing up for the patient portal called "MyChart".  Sign up information is provided on this After Visit Summary.  MyChart is used to connect with patients for Virtual Visits (Telemedicine).  Patients are able to view lab/test results, encounter notes, upcoming appointments, etc.  Non-urgent messages can be sent to your provider as well.   To learn more about what you can do with MyChart, go to [email or web address].    Your next appointment:    12 month(s)  The format for your next appointment:   In Person  Provider:   You may see Dr Rayann Heman or one of the following Advanced Practice Providers on your designated Care Team:    Chanetta Marshall, NP  Tommye Standard, PA-C  Legrand Como "[place]" [place], [place]

## 2020-05-03 ENCOUNTER — Ambulatory Visit (HOSPITAL_COMMUNITY): Payer: BC Managed Care – PPO | Attending: Cardiology

## 2020-05-03 ENCOUNTER — Other Ambulatory Visit: Payer: Self-pay

## 2020-05-03 DIAGNOSIS — I4811 Longstanding persistent atrial fibrillation: Secondary | ICD-10-CM | POA: Diagnosis not present

## 2020-05-03 LAB — ECHOCARDIOGRAM COMPLETE: S' Lateral: 2.9 cm

## 2020-05-03 MED ORDER — PERFLUTREN LIPID MICROSPHERE
1.0000 mL | INTRAVENOUS | Status: AC | PRN
Start: 2020-05-03 — End: 2020-05-03
  Administered 2020-05-03: 1 mL via INTRAVENOUS

## 2020-06-07 ENCOUNTER — Other Ambulatory Visit: Payer: Self-pay | Admitting: Internal Medicine

## 2020-06-07 ENCOUNTER — Other Ambulatory Visit: Payer: Self-pay | Admitting: *Deleted

## 2020-06-07 NOTE — Telephone Encounter (Signed)
Pt last saw Dr Rayann Heman on 04/17/20, last labs 10/19/19 Creat 1.05, age 64, weight 121.1kg, based on specified criteria pt is on appropriate dosage of Eliquis 5mg  BID.  Will refill rx.

## 2020-09-10 ENCOUNTER — Telehealth: Payer: Self-pay | Admitting: Pharmacist

## 2020-09-10 NOTE — Telephone Encounter (Signed)
Eliquis prior authorization denied, appeals letter has been faxed.

## 2020-10-22 NOTE — Telephone Encounter (Signed)
Called insurance to follow up with Eliquis appeals and was advised that pt was approved 10/18/20 - 10/18/21. Called pt to let him know and left message. Will also send pt MyChart message.

## 2020-11-05 ENCOUNTER — Other Ambulatory Visit: Payer: Self-pay | Admitting: Pharmacist

## 2020-11-05 MED ORDER — METOPROLOL SUCCINATE ER 100 MG PO TB24: 100.0000 mg | ORAL_TABLET | Freq: Every day | ORAL | 11 refills | Status: DC

## 2021-04-17 ENCOUNTER — Ambulatory Visit: Payer: BC Managed Care – PPO | Admitting: Physician Assistant

## 2021-05-08 ENCOUNTER — Other Ambulatory Visit: Payer: Self-pay

## 2021-05-08 ENCOUNTER — Ambulatory Visit: Payer: BC Managed Care – PPO | Admitting: Internal Medicine

## 2021-05-08 VITALS — BP 142/106 | HR 82 | Ht 72.0 in | Wt 264.8 lb

## 2021-05-08 DIAGNOSIS — I10 Essential (primary) hypertension: Secondary | ICD-10-CM | POA: Diagnosis not present

## 2021-05-08 DIAGNOSIS — I4811 Longstanding persistent atrial fibrillation: Secondary | ICD-10-CM | POA: Diagnosis not present

## 2021-05-08 DIAGNOSIS — R0600 Dyspnea, unspecified: Secondary | ICD-10-CM | POA: Diagnosis not present

## 2021-05-08 DIAGNOSIS — I4821 Permanent atrial fibrillation: Secondary | ICD-10-CM | POA: Diagnosis not present

## 2021-05-08 MED ORDER — NEBIVOLOL HCL 5 MG PO TABS: 5.0000 mg | ORAL_TABLET | Freq: Every day | ORAL | 3 refills | Status: DC

## 2021-05-08 NOTE — Patient Instructions (Addendum)
Medication Instructions:  Stop Metoprolol succinate  Start Bystolic 5 mg daily Your physician recommends that you continue on your current medications as directed. Please refer to the Current Medication list given to you today.  Labwork: None ordered.  Testing/Procedures: None ordered.  Follow-Up: Your physician wants you to follow-up in: 05/19/22 at 9:30 am with Dr. Rayann Heman.   Any Other Special Instructions Will Be Listed Below (If Applicable).  If you need a refill on your cardiac medications before your next appointment, please call your pharmacy.

## 2021-05-08 NOTE — Progress Notes (Signed)
PCP: Harlan Stains, MD   Primary EP: Dr Rayann Heman  Calvin Collins is a 65 y.o. male who presents today for routine electrophysiology followup.  Since last being seen in our clinic, the patient reports doing reasonably well.  He recently has had issues with back pain.  In addition, he fell while walking his dog (mechanical fall) and fractured his shoulder.  He continues to have stable SOB.  He feels that this was a little worse after switching from bystolic to toprol.  His BP and heart rates have also been less controlled with this change.  He reports that this was a formulary substitution by his insurance. Today, he denies symptoms of palpitations, chest pain, shortness of breath,  lower extremity edema, dizziness, presyncope, or syncope.  The patient is otherwise without complaint today.   Past Medical History:  Diagnosis Date   Anxiety    Asthmatic bronchitis    ED (erectile dysfunction)    GERD (gastroesophageal reflux disease)    History of colon polyps    Hypercholesteremia    Hypertension    Obesity    Persistent atrial fibrillation ([place]) 02/02/2018   Squamous cell carcinoma of skin    SVT (supraventricular tachycardia) (HCC)    Past Surgical History:  Procedure Laterality Date   CARDIOVERSION N/A 03/02/2018   Procedure: CARDIOVERSION;  Surgeon: Jerline Pain, MD;  Location: [facility];  Service: Cardiovascular;  Laterality: N/A;   left arm surgery     skin cancer removed     WISDOM TOOTH EXTRACTION      ROS- all systems are reviewed and negatives except as per HPI above  Current Outpatient Medications  Medication Sig Dispense Refill   albuterol (PROVENTIL HFA;VENTOLIN HFA) 108 (90 Base) MCG/ACT inhaler Inhale into the lungs every 6 (six) hours as needed for wheezing or shortness of breath.     ALPRAZolam (XANAX) 0.25 MG tablet Take 1-2 tabs (0.'25mg'$ -0.'50mg'$ ) 30-60 minutes before procedure. May repeat if needed.Do not drive. 4 tablet 0   budesonide (PULMICORT) 180 MCG/ACT  inhaler Inhale 1-2 puffs into the lungs daily.      DULoxetine (CYMBALTA) 20 MG capsule Take 1 capsule by mouth daily.     ELIQUIS 5 MG TABS tablet TAKE 1 TABLET BY MOUTH TWICE DAILY. APPOINTMENT REQUIRED FOR FURTHER REFILLS 180 tablet 3   LORazepam (ATIVAN) 1 MG tablet TAKE 1 TABLET(1 MG) BY MOUTH EVERY 8 HOURS AS NEEDED FOR ANXIETY 90 tablet 0   metoprolol succinate (TOPROL-XL) 100 MG 24 hr tablet Take 1 tablet (100 mg total) by mouth daily. Take with or immediately following a meal. 30 tablet 11   No current facility-administered medications for this visit.    Physical Exam: Vitals:   05/08/21 0930  BP: (!) 142/106  Pulse: 82  SpO2: 97%  Weight: 264 lb 12.8 oz (120.1 kg)  Height: 6' (1.829 m)    GEN- The patient is well appearing, alert and oriented x 3 today.   Head- normocephalic, atraumatic Eyes-  Sclera clear, conjunctiva pink Ears- hearing intact Oropharynx- clear Lungs- Clear to ausculation bilaterally, normal work of breathing Heart- irregular rate and rhythm, no murmurs, rubs or gallops, PMI not laterally displaced GI- soft, NT, ND, + BS Extremities- no clubbing, cyanosis, or edema  Wt Readings from Last 3 Encounters:  05/08/21 264 lb 12.8 oz (120.1 kg)  04/17/20 267 lb (121.1 kg)  06/06/19 258 lb (117 kg)   Echo 05/03/20- EF 60%   EKG tracing ordered today is personally reviewed and  shows afib, V rates 80s  Assessment and Plan:  Longstanding persistent atrial fibrillation Rate controlled.  Asymptomatic Chads2vasc score is 1.  He wishes to stay on [place].  Doing well with eliquis He would like to switch back to bystolic '5mg'$  daily from toprol as he is not tolerating toprol as well (as above).  Therapeutic strategies for afib including medicine  (tikosyn and amiodarone) and ablation were discussed in detail with the patient today. He has only tried flecainide and did not tolerate this previously.   Risk, benefits, and alternatives to EP study and radiofrequency  ablation for afib were also discussed in detail today. These risks include but are not limited to stroke, bleeding, vascular damage, tamponade, perforation, damage to the esophagus, lungs, and other structures, pulmonary vein stenosis, worsening renal function, and death. The patient understands these risk and wishes to think about this further.  He is not ready to proceed.  We also discussed convergent procedure and MAZE as options.   2. HTN Stable No change required today  3. Overweight Body mass index is 35.91 kg/m. Lifestyle modification advised  Risks, benefits and potential toxicities for medications prescribed and/or refilled reviewed with patient today.   Thompson Grayer MD, [facility] 05/08/2021 9:37 AM

## 2021-05-09 ENCOUNTER — Telehealth: Payer: Self-pay

## 2021-05-09 NOTE — Telephone Encounter (Addendum)
**Note De-identified Elisha Mcgruder Obfuscation** -----  **Note De-Identified Cherity Blickenstaff Obfuscation** Message from Calvin Jewel, RN sent at 05/08/2021 10:05 AM EDT ----- Regarding: appeal Calvin Collins is switching the patient back to Bystolic 5 mg daily from Metoprolol Succinate 100 mg daily. The medication was switched initially because his insurance.

## 2021-05-09 NOTE — Telephone Encounter (Signed)
**Note De-Identified Treniya Lobb Obfuscation** I started a Nebivolol PA through covermymeds:  Aristotle Hedberg (Key: [id number]) Nebivolol HCl '5MG'$  tablets    Status: N/A Created: August 31st, 2022  I then called [place] and was advised that a PA is not required for the pts Nebivolol.  I called the pt and made him aware that Nebivolol is covered under his plan and a PA is not required. He expressed concerns as he states that he was taking Bystolic in the past but his ins sent him a letter in February stating they would no longer cover Bystolic.  I have read the letter that he sent to [place] attached to a [place] on 11/05/20 and it appears the letter is referring to name brand Bystolic and not generic Nebivolol.  I did explain this to the pt and I asked him to call me back or to reach out to me [facility] if he finds that Nebivolol is too expensive. He verbalized understanding and thanked me for calling him to discuss.

## 2021-06-28 DIAGNOSIS — I4821 Permanent atrial fibrillation: Secondary | ICD-10-CM

## 2021-07-05 ENCOUNTER — Other Ambulatory Visit: Payer: Self-pay | Admitting: Internal Medicine

## 2021-07-05 NOTE — Telephone Encounter (Signed)
Prescription refill request for Eliquis received. Indication: Afib  Last office visit:05/08/21 ([place])  Scr: 0.98 (10/23/20) Age: 65 Weight: 120.1kg  Appropriate dose and refill sent to requested pharmacy.

## 2021-07-18 ENCOUNTER — Encounter: Payer: Self-pay | Admitting: *Deleted

## 2021-08-07 ENCOUNTER — Other Ambulatory Visit: Payer: Self-pay

## 2021-08-07 ENCOUNTER — Ambulatory Visit: Payer: BC Managed Care – PPO | Admitting: Internal Medicine

## 2021-08-07 VITALS — BP 146/102 | HR 89 | Ht 72.0 in | Wt 266.0 lb

## 2021-08-07 DIAGNOSIS — I4821 Permanent atrial fibrillation: Secondary | ICD-10-CM

## 2021-08-07 DIAGNOSIS — I10 Essential (primary) hypertension: Secondary | ICD-10-CM | POA: Diagnosis not present

## 2021-08-07 DIAGNOSIS — D6869 Other thrombophilia: Secondary | ICD-10-CM

## 2021-08-07 LAB — BASIC METABOLIC PANEL
BUN/Creatinine Ratio: 15 (ref 10–24)
BUN: 15 mg/dL (ref 8–27)
CO2: 26 mmol/L (ref 20–29)
Calcium: 9.4 mg/dL (ref 8.6–10.2)
Chloride: 106 mmol/L (ref 96–106)
Creatinine, Ser: 0.97 mg/dL (ref 0.76–1.27)
Glucose: 91 mg/dL (ref 70–99)
Potassium: 4.6 mmol/L (ref 3.5–5.2)
Sodium: 140 mmol/L (ref 134–144)
eGFR: 88 mL/min/{1.73_m2} (ref >59)

## 2021-08-07 LAB — CBC WITH DIFFERENTIAL/PLATELET
Basophils Absolute: 0 10*3/uL (ref 0.0–0.2)
Basos: 1 %
EOS (ABSOLUTE): 0.2 10*3/uL (ref 0.0–0.4)
Eos: 3 %
Hematocrit: 43.7 % (ref 37.5–51.0)
Hemoglobin: 14.9 g/dL (ref 13.0–17.7)
Lymphocytes Absolute: 1.9 10*3/uL (ref 0.7–3.1)
Lymphs: 31 %
MCH: 33.6 pg — ABNORMAL HIGH (ref 26.6–33.0)
MCHC: 34.1 g/dL (ref 31.5–35.7)
MCV: 98 fL — ABNORMAL HIGH (ref 79–97)
Monocytes Absolute: 0.7 10*3/uL (ref 0.1–0.9)
Monocytes: 11 %
Neutrophils Absolute: 3.4 10*3/uL (ref 1.4–7.0)
Neutrophils: 54 %
Platelets: 235 10*3/uL (ref 150–450)
RBC: 4.44 x10E6/uL (ref 4.14–5.80)
RDW: 12.9 % (ref 11.6–15.4)
WBC: 6.2 10*3/uL (ref 3.4–10.8)

## 2021-08-07 NOTE — Progress Notes (Signed)
PCP: Harlan Stains, MD   Primary EP: Dr Rayann Heman  Calvin Collins is a 65 y.o. male who presents today for routine electrophysiology followup.  Since last being seen in our clinic, the patient reports doing reasonably well.  He has been persistently in AF for the past year.  Today, he denies symptoms of palpitations, chest pain, shortness of breath,  lower extremity edema, dizziness, presyncope, or syncope.  The patient is otherwise without complaint today.   Past Medical History:  Diagnosis Date   Anxiety    Asthmatic bronchitis    ED (erectile dysfunction)    GERD (gastroesophageal reflux disease)    History of colon polyps    Hypercholesteremia    Hypertension    Obesity    Persistent atrial fibrillation ([place]) 02/02/2018   Squamous cell carcinoma of skin    SVT (supraventricular tachycardia) (HCC)    Past Surgical History:  Procedure Laterality Date   CARDIOVERSION N/A 03/02/2018   Procedure: CARDIOVERSION;  Surgeon: Jerline Pain, MD;  Location: [facility];  Service: Cardiovascular;  Laterality: N/A;   left arm surgery     skin cancer removed     WISDOM TOOTH EXTRACTION      ROS- all systems are reviewed and negatives except as per HPI above  Current Outpatient Medications  Medication Sig Dispense Refill   albuterol (PROVENTIL HFA;VENTOLIN HFA) 108 (90 Base) MCG/ACT inhaler Inhale into the lungs every 6 (six) hours as needed for wheezing or shortness of breath.     ALPRAZolam (XANAX) 0.25 MG tablet Take 1-2 tabs (0.25mg -0.50mg ) 30-60 minutes before procedure. May repeat if needed.Do not drive. 4 tablet 0   budesonide (PULMICORT) 180 MCG/ACT inhaler Inhale 1-2 puffs into the lungs daily.      DULoxetine (CYMBALTA) 20 MG capsule Take 1 capsule by mouth daily.     ELIQUIS 5 MG TABS tablet TAKE 1 TABLET BY MOUTH TWICE DAILY. APPOINTMENT REQUIRED FOR FURTHER REFILLS 180 tablet 3   LORazepam (ATIVAN) 1 MG tablet TAKE 1 TABLET(1 MG) BY MOUTH EVERY 8 HOURS AS NEEDED FOR ANXIETY  90 tablet 0   nebivolol (BYSTOLIC) 5 MG tablet Take 1 tablet (5 mg total) by mouth daily. 90 tablet 3   No current facility-administered medications for this visit.    Physical Exam: Vitals:   08/07/21 1037  BP: (!) 146/102  Pulse: 89  SpO2: 96%  Weight: 266 lb (120.7 kg)  Height: 6' (1.829 m)    GEN- The patient is well appearing, alert and oriented x 3 today.   Head- normocephalic, atraumatic Eyes-  Sclera clear, conjunctiva pink Ears- hearing intact Oropharynx- clear Lungs- Clear to ausculation bilaterally, normal work of breathing Heart- iRRR GI- soft, NT, ND, + BS Extremities- no clubbing, cyanosis, or edema  Wt Readings from Last 3 Encounters:  08/07/21 266 lb (120.7 kg)  05/08/21 264 lb 12.8 oz (120.1 kg)  04/17/20 267 lb (121.1 kg)   EKG tracing ordered today is personally reviewed and shows afib, V rate 89 bpm  Assessment and Plan:  Longstanding persistent afib Rate controlled Chads2vasc score is 1.  He is on eliquis   he has failed medical therapy with flecainide.   Therapeutic strategies for afib including medicine  (tikosyn, amiodarone) and ablation were discussed in detail with the patient today. We also discussed MAZE and convergent procedures today.  Risk, benefits, and alternatives to EP study and radiofrequency ablation for afib were also discussed in detail today. These risks include but are not limited to  stroke, bleeding, vascular damage, tamponade, perforation, damage to the esophagus, lungs, and other structures, pulmonary vein stenosis, worsening renal function, and death. The patient understands these risk and wishes to proceed.  We will therefore proceed with catheter ablation at the next available time.  Carto, ICE, anesthesia are requested for the procedure.  Will also obtain cardiac CT prior to the procedure to exclude LAA thrombus and further evaluate atrial anatomy.  2. HTN Stable No change required today  3. Obesity Body mass index is  36.08 kg/m. Lifestyle modification advised   Thompson Grayer MD, [facility] 08/07/2021 11:04 AM

## 2021-08-07 NOTE — Patient Instructions (Addendum)
Medication Instructions:  Your physician recommends that you continue on your current medications as directed. Please refer to the Current Medication list given to you today. *If you need a refill on your cardiac medications before your next appointment, please call your pharmacy*  Lab Work: CBC, BMP If you have labs (blood work) drawn today and your tests are completely normal, you will receive your results only by: [place] (if you have MyChart) OR A paper copy in the mail If you have any lab test that is abnormal or we need to change your treatment, we will call you to review the results.  Testing/Procedures: Your physician has requested that you have cardiac CT. Cardiac computed tomography (CT) is a painless test that uses an x-ray machine to take clear, detailed pictures of your heart. For further information please visit [email or web address]. Please follow instruction sheet as given.  Your physician has recommended that you have an ablation. Catheter ablation is a medical procedure used to treat some cardiac arrhythmias (irregular heartbeats). During catheter ablation, a long, thin, flexible tube is put into a blood vessel in your groin (upper thigh), or neck. This tube is called an ablation catheter. It is then guided to your heart through the blood vessel. Radio frequency waves destroy small areas of heart tissue where abnormal heartbeats may cause an arrhythmia to start. Please see the instruction sheet given to you today.   Follow-Up: At [facility], you and your health needs are our priority.  As part of our continuing mission to provide you with exceptional heart care, we have created designated Provider Care Teams.  These Care Teams include your primary Cardiologist (physician) and Advanced Practice Providers (APPs -  Physician Assistants and Nurse Practitioners) who all work together to provide you with the care you need, when you need it.  We recommend signing up for the  patient portal called "MyChart".  Sign up information is provided on this After Visit Summary.  MyChart is used to connect with patients for Virtual Visits (Telemedicine).  Patients are able to view lab/test results, encounter notes, upcoming appointments, etc.  Non-urgent messages can be sent to your provider as well.   To learn more about what you can do with MyChart, go to [email or web address].    Any Other Special Instructions Will Be Listed Below (If Applicable).  Cardiac Ablation Cardiac ablation is a procedure to destroy (ablate) some heart tissue that is sending bad signals. These bad signals cause problems in heart rhythm. The heart has many areas that make these signals. If there are problems in these areas, they can make the heart beat in a way that is not normal. Destroying some tissues can help make the heart rhythm normal. Tell your doctor about: Any allergies you have. All medicines you are taking. These include vitamins, herbs, eye drops, creams, and over-the-counter medicines. Any problems you or family members have had with medicines that make you fall asleep (anesthetics). Any blood disorders you have. Any surgeries you have had. Any medical conditions you have, such as kidney failure. Whether you are pregnant or may be pregnant. What are the risks? This is a safe procedure. But problems may occur, including: Infection. Bruising and bleeding. Bleeding into the chest. Stroke or blood clots. Damage to nearby areas of your body. Allergies to medicines or dyes. The need for a pacemaker if the normal system is damaged. Failure of the procedure to treat the problem. What happens before the procedure? Medicines Ask your doctor about: Changing or  stopping your normal medicines. This is important. Taking aspirin and ibuprofen. Do not take these medicines unless your doctor tells you to take them. Taking other medicines, vitamins, herbs, and supplements. General  instructions Follow instructions from your doctor about what you cannot eat or drink. Plan to have someone take you home from the hospital or clinic. If you will be going home right after the procedure, plan to have someone with you for 24 hours. Ask your doctor what steps will be taken to prevent infection. What happens during the procedure?  An IV tube will be put into one of your veins. You will be given a medicine to help you relax. The skin on your neck or groin will be numbed. A cut (incision) will be made in your neck or groin. A needle will be put through your cut and into a large vein. A tube (catheter) will be put into the needle. The tube will be moved to your heart. Dye may be put through the tube. This helps your doctor see your heart. Small devices (electrodes) on the tube will send out signals. A type of energy will be used to destroy some heart tissue. The tube will be taken out. Pressure will be held on your cut. This helps stop bleeding. A bandage will be put over your cut. The exact procedure may vary among doctors and hospitals. What happens after the procedure? You will be watched until you leave the hospital or clinic. This includes checking your heart rate, breathing rate, oxygen, and blood pressure. Your cut will be watched for bleeding. You will need to lie still for a few hours. Do not drive for 24 hours or as long as your doctor tells you. Summary Cardiac ablation is a procedure to destroy some heart tissue. This is done to treat heart rhythm problems. Tell your doctor about any medical conditions you may have. Tell him or her about all medicines you are taking to treat them. This is a safe procedure. But problems may occur. These include infection, bruising, bleeding, and damage to nearby areas of your body. Follow what your doctor tells you about food and drink. You may also be told to change or stop some of your medicines. After the procedure, do not drive  for 24 hours or as long as your doctor tells you. This information is not intended to replace advice given to you by your health care provider. Make sure you discuss any questions you have with your health care provider. Document Revised: 07/28/2019 Document Reviewed: 07/28/2019 Elsevier Patient Education  2022 [employer].

## 2021-08-07 NOTE — H&P (View-Only) (Signed)
PCP: Harlan Stains, MD   Primary EP: Dr Rayann Heman  Calvin Collins is a 65 y.o. male who presents today for routine electrophysiology followup.  Since last being seen in our clinic, the patient reports doing reasonably well.  He has been persistently in AF for the past year.  Today, he denies symptoms of palpitations, chest pain, shortness of breath,  lower extremity edema, dizziness, presyncope, or syncope.  The patient is otherwise without complaint today.   Past Medical History:  Diagnosis Date   Anxiety    Asthmatic bronchitis    ED (erectile dysfunction)    GERD (gastroesophageal reflux disease)    History of colon polyps    Hypercholesteremia    Hypertension    Obesity    Persistent atrial fibrillation ([place]) 02/02/2018   Squamous cell carcinoma of skin    SVT (supraventricular tachycardia) (HCC)    Past Surgical History:  Procedure Laterality Date   CARDIOVERSION N/A 03/02/2018   Procedure: CARDIOVERSION;  Surgeon: Jerline Pain, MD;  [facility];  Service: Cardiovascular;  Laterality: N/A;   left arm surgery     skin cancer removed     WISDOM TOOTH EXTRACTION      ROS- all systems are reviewed and negatives except as per HPI above  Current Outpatient Medications  Medication Sig Dispense Refill   albuterol (PROVENTIL HFA;VENTOLIN HFA) 108 (90 Base) MCG/ACT inhaler Inhale into the lungs every 6 (six) hours as needed for wheezing or shortness of breath.     ALPRAZolam (XANAX) 0.25 MG tablet Take 1-2 tabs (0.25mg -0.50mg ) 30-60 minutes before procedure. May repeat if needed.Do not drive. 4 tablet 0   budesonide (PULMICORT) 180 MCG/ACT inhaler Inhale 1-2 puffs into the lungs daily.      DULoxetine (CYMBALTA) 20 MG capsule Take 1 capsule by mouth daily.     ELIQUIS 5 MG TABS tablet TAKE 1 TABLET BY MOUTH TWICE DAILY. APPOINTMENT REQUIRED FOR FURTHER REFILLS 180 tablet 3   LORazepam (ATIVAN) 1 MG tablet TAKE 1 TABLET(1 MG) BY MOUTH EVERY 8 HOURS AS NEEDED FOR ANXIETY  90 tablet 0   nebivolol (BYSTOLIC) 5 MG tablet Take 1 tablet (5 mg total) by mouth daily. 90 tablet 3   No current facility-administered medications for this visit.    Physical Exam: Vitals:   08/07/21 1037  BP: (!) 146/102  Pulse: 89  SpO2: 96%  Weight: 266 lb (120.7 kg)  Height: 6' (1.829 m)    GEN- The patient is well appearing, alert and oriented x 3 today.   Head- normocephalic, atraumatic Eyes-  Sclera clear, conjunctiva pink Ears- hearing intact Oropharynx- clear Lungs- Clear to ausculation bilaterally, normal work of breathing Heart- iRRR GI- soft, NT, ND, + BS Extremities- no clubbing, cyanosis, or edema  Wt Readings from Last 3 Encounters:  08/07/21 266 lb (120.7 kg)  05/08/21 264 lb 12.8 oz (120.1 kg)  04/17/20 267 lb (121.1 kg)   EKG tracing ordered today is personally reviewed and shows afib, V rate 89 bpm  Assessment and Plan:  Longstanding persistent afib Rate controlled Chads2vasc score is 1.  He is on eliquis   he has failed medical therapy with flecainide.   Therapeutic strategies for afib including medicine  (tikosyn, amiodarone) and ablation were discussed in detail with the patient today. We also discussed MAZE and convergent procedures today.  Risk, benefits, and alternatives to EP study and radiofrequency ablation for afib were also discussed in detail today. These risks include but are not limited to  stroke, bleeding, vascular damage, tamponade, perforation, damage to the esophagus, lungs, and other structures, pulmonary vein stenosis, worsening renal function, and death. The patient understands these risk and wishes to proceed.  We will therefore proceed with catheter ablation at the next available time.  Carto, ICE, anesthesia are requested for the procedure.  Will also obtain cardiac CT prior to the procedure to exclude LAA thrombus and further evaluate atrial anatomy.  2. HTN Stable No change required today  3. Obesity Body mass index is  36.08 kg/m. Lifestyle modification advised   Thompson Grayer MD, [facility] 08/07/2021 11:04 AM

## 2021-08-13 ENCOUNTER — Telehealth (HOSPITAL_COMMUNITY): Payer: Self-pay | Admitting: Emergency Medicine

## 2021-08-13 NOTE — Telephone Encounter (Signed)
Pt returning regarding upcoming cardiac imaging study; pt verbalizes understanding of appt date/time, parking situation and where to check in, pre-test NPO status and medications ordered, and verified current allergies; name and call back number provided for further questions should they arise Marchia Bond RN Navigator Cardiac Imaging [facility] [phone] office [phone] cell   Arrival 730 Taking daily nebivolol 2 hr prior to scan Denies IV issues

## 2021-08-13 NOTE — Telephone Encounter (Signed)
Attempted to call patient regarding upcoming cardiac CT appointment. °Left message on voicemail with name and callback number °Arzell Mcgeehan RN Navigator Cardiac Imaging °[facility] °[phone] Office °[phone] Cell ° °

## 2021-08-15 ENCOUNTER — Ambulatory Visit (HOSPITAL_COMMUNITY)
Admission: RE | Admit: 2021-08-15 | Discharge: 2021-08-15 | Disposition: A | Discharge status: Home / Self Care | Payer: BC Managed Care – PPO | Source: Ambulatory Visit | Attending: Internal Medicine | Admitting: Internal Medicine

## 2021-08-15 ENCOUNTER — Other Ambulatory Visit: Payer: Self-pay

## 2021-08-15 DIAGNOSIS — I4821 Permanent atrial fibrillation: Secondary | ICD-10-CM

## 2021-08-15 MED ORDER — IOHEXOL 350 MG/ML SOLN
80.0000 mL | Freq: Once | INTRAVENOUS | Status: AC | PRN
  Administered 2021-08-15: 80 mL via INTRAVENOUS

## 2021-08-18 ENCOUNTER — Encounter: Payer: Self-pay | Admitting: Internal Medicine

## 2021-08-21 NOTE — Pre-Procedure Instructions (Signed)
Attempted to call patient regarding procedure instructions.  Left voice mail on the following items: Arrival time 0530 Nothing to eat or drink after midnight No meds AM of procedure Responsible person to drive you home and stay with you for 24 hrs  Have you missed any doses of anti-coagulant Eliquis- take both doses today none in the morning.

## 2021-08-22 ENCOUNTER — Ambulatory Visit (HOSPITAL_COMMUNITY): Payer: BC Managed Care – PPO | Admitting: Anesthesiology

## 2021-08-22 ENCOUNTER — Encounter (HOSPITAL_COMMUNITY)
Admission: RE | Disposition: A | Discharge status: Home / Self Care | Payer: Self-pay | Source: Home / Self Care | Attending: Internal Medicine

## 2021-08-22 ENCOUNTER — Other Ambulatory Visit: Payer: Self-pay

## 2021-08-22 ENCOUNTER — Ambulatory Visit (HOSPITAL_COMMUNITY)
Admission: RE | Admit: 2021-08-22 | Discharge: 2021-08-22 | Disposition: A | Discharge status: Home / Self Care | Payer: BC Managed Care – PPO | Attending: Internal Medicine | Admitting: Internal Medicine

## 2021-08-22 DIAGNOSIS — I10 Essential (primary) hypertension: Secondary | ICD-10-CM | POA: Diagnosis not present

## 2021-08-22 DIAGNOSIS — I4811 Longstanding persistent atrial fibrillation: Secondary | ICD-10-CM | POA: Diagnosis present

## 2021-08-22 DIAGNOSIS — Z7901 Long term (current) use of anticoagulants: Secondary | ICD-10-CM | POA: Diagnosis not present

## 2021-08-22 DIAGNOSIS — E669 Obesity, unspecified: Secondary | ICD-10-CM | POA: Diagnosis not present

## 2021-08-22 DIAGNOSIS — Z6836 Body mass index (BMI) 36.0-36.9, adult: Secondary | ICD-10-CM | POA: Insufficient documentation

## 2021-08-22 DIAGNOSIS — I4819 Other persistent atrial fibrillation: Secondary | ICD-10-CM | POA: Diagnosis not present

## 2021-08-22 HISTORY — PX: ATRIAL FIBRILLATION ABLATION: EP1191

## 2021-08-22 LAB — POCT ACTIVATED CLOTTING TIME
Activated Clotting Time: 269 seconds
Activated Clotting Time: 299 seconds
Activated Clotting Time: 311 seconds

## 2021-08-22 SURGERY — ATRIAL FIBRILLATION ABLATION
Anesthesia: General

## 2021-08-22 MED ORDER — EPHEDRINE SULFATE-NACL 50-0.9 MG/10ML-% IV SOSY
PREFILLED_SYRINGE | INTRAVENOUS | Status: DC | PRN
  Administered 2021-08-22: 5 mg via INTRAVENOUS

## 2021-08-22 MED ORDER — PHENYLEPHRINE 40 MCG/ML (10ML) SYRINGE FOR IV PUSH (FOR BLOOD PRESSURE SUPPORT)
PREFILLED_SYRINGE | INTRAVENOUS | Status: DC | PRN
  Administered 2021-08-22: 160 ug via INTRAVENOUS

## 2021-08-22 MED ORDER — ROCURONIUM BROMIDE 10 MG/ML (PF) SYRINGE
PREFILLED_SYRINGE | INTRAVENOUS | Status: DC | PRN
  Administered 2021-08-22: 60 mg via INTRAVENOUS
  Administered 2021-08-22: 30 mg via INTRAVENOUS

## 2021-08-22 MED ORDER — ACETAMINOPHEN 325 MG PO TABS: 650.0000 mg | ORAL_TABLET | ORAL | Status: DC | PRN

## 2021-08-22 MED ORDER — DEXAMETHASONE SODIUM PHOSPHATE 10 MG/ML IJ SOLN
INTRAMUSCULAR | Status: DC | PRN
  Administered 2021-08-22: 5 mg via INTRAVENOUS

## 2021-08-22 MED ORDER — HEPARIN (PORCINE) IN NACL 2000-0.9 UNIT/L-% IV SOLN
INTRAVENOUS | Status: DC | PRN
  Administered 2021-08-22: 1000 mL

## 2021-08-22 MED ORDER — PROPOFOL 10 MG/ML IV BOLUS
INTRAVENOUS | Status: DC | PRN
  Administered 2021-08-22: 150 mg via INTRAVENOUS

## 2021-08-22 MED ORDER — APIXABAN 5 MG PO TABS
5.0000 mg | ORAL_TABLET | Freq: Once | ORAL | Status: AC
  Administered 2021-08-22: 5 mg via ORAL
  Filled 2021-08-22: qty 1

## 2021-08-22 MED ORDER — HEPARIN SODIUM (PORCINE) 1000 UNIT/ML IJ SOLN
INTRAMUSCULAR | Status: DC | PRN
  Administered 2021-08-22: 4000 [IU] via INTRAVENOUS
  Administered 2021-08-22 (×2): 3000 [IU] via INTRAVENOUS

## 2021-08-22 MED ORDER — HEPARIN SODIUM (PORCINE) 1000 UNIT/ML IJ SOLN
INTRAMUSCULAR | Status: AC
  Filled 2021-08-22: qty 20

## 2021-08-22 MED ORDER — SODIUM CHLORIDE 0.9 % IV SOLN: 250.0000 mL | INTRAVENOUS | Status: DC | PRN

## 2021-08-22 MED ORDER — HEPARIN (PORCINE) IN NACL 2000-0.9 UNIT/L-% IV SOLN
INTRAVENOUS | Status: AC
  Filled 2021-08-22: qty 1000

## 2021-08-22 MED ORDER — SUGAMMADEX SODIUM 200 MG/2ML IV SOLN
INTRAVENOUS | Status: DC | PRN
Start: 2021-08-22 — End: 2021-08-22
  Administered 2021-08-22: 200 mg via INTRAVENOUS

## 2021-08-22 MED ORDER — HYDROCODONE-ACETAMINOPHEN 5-325 MG PO TABS: 1.0000 | ORAL_TABLET | ORAL | Status: DC | PRN

## 2021-08-22 MED ORDER — HEPARIN SODIUM (PORCINE) 1000 UNIT/ML IJ SOLN
INTRAMUSCULAR | Status: DC | PRN
  Administered 2021-08-22: 15000 [IU] via INTRAVENOUS
  Administered 2021-08-22: 1000 [IU] via INTRAVENOUS

## 2021-08-22 MED ORDER — HEPARIN (PORCINE) IN NACL 1000-0.9 UT/500ML-% IV SOLN
INTRAVENOUS | Status: DC | PRN
  Administered 2021-08-22 (×2): 500 mL

## 2021-08-22 MED ORDER — FENTANYL CITRATE (PF) 100 MCG/2ML IJ SOLN
INTRAMUSCULAR | Status: DC | PRN
  Administered 2021-08-22: 100 ug via INTRAVENOUS

## 2021-08-22 MED ORDER — ONDANSETRON HCL 4 MG/2ML IJ SOLN
INTRAMUSCULAR | Status: DC | PRN
  Administered 2021-08-22: 4 mg via INTRAVENOUS

## 2021-08-22 MED ORDER — SODIUM CHLORIDE 0.9% FLUSH: 3.0000 mL | INTRAVENOUS | Status: DC | PRN

## 2021-08-22 MED ORDER — SODIUM CHLORIDE 0.9% FLUSH: 3.0000 mL | Freq: Two times a day (BID) | INTRAVENOUS | Status: DC

## 2021-08-22 MED ORDER — ONDANSETRON HCL 4 MG/2ML IJ SOLN: 4.0000 mg | Freq: Four times a day (QID) | INTRAMUSCULAR | Status: DC | PRN

## 2021-08-22 MED ORDER — FENTANYL CITRATE (PF) 100 MCG/2ML IJ SOLN
INTRAMUSCULAR | Status: AC
  Filled 2021-08-22: qty 2

## 2021-08-22 MED ORDER — PHENYLEPHRINE HCL-NACL 20-0.9 MG/250ML-% IV SOLN
INTRAVENOUS | Status: DC | PRN
  Administered 2021-08-22: 50 ug/min via INTRAVENOUS

## 2021-08-22 MED ORDER — PANTOPRAZOLE SODIUM 40 MG PO TBEC: 40.0000 mg | DELAYED_RELEASE_TABLET | Freq: Every day | ORAL | 0 refills | Status: DC

## 2021-08-22 MED ORDER — LIDOCAINE 2% (20 MG/ML) 5 ML SYRINGE
INTRAMUSCULAR | Status: DC | PRN
  Administered 2021-08-22: 60 mg via INTRAVENOUS

## 2021-08-22 MED ORDER — SODIUM CHLORIDE 0.9 % IV SOLN: INTRAVENOUS | Status: DC

## 2021-08-22 SURGICAL SUPPLY — 19 items
BLANKET WARM UNDERBOD FULL ACC (MISCELLANEOUS) ×2 IMPLANT
CATH OCTARAY 2.0 F 3-3-3-3-3 (CATHETERS) ×1 IMPLANT
CATH SMTCH THERMOCOOL SF DF (CATHETERS) ×1 IMPLANT
CATH SOUNDSTAR ECO 8FR (CATHETERS) ×1 IMPLANT
CATH WEBSTER BI DIR CS D-F CRV (CATHETERS) ×1 IMPLANT
CLOSURE PERCLOSE PROSTYLE (VASCULAR PRODUCTS) ×3 IMPLANT
COVER SWIFTLINK CONNECTOR (BAG) ×2 IMPLANT
MAT PREVALON FULL STRYKER (MISCELLANEOUS) ×1 IMPLANT
NDL BAYLIS TRANSSEPTAL 71CM (NEEDLE) IMPLANT
NEEDLE BAYLIS TRANSSEPTAL 71CM (NEEDLE) ×2 IMPLANT
PACK EP LATEX FREE (CUSTOM PROCEDURE TRAY) ×2
PACK EP LF (CUSTOM PROCEDURE TRAY) ×1 IMPLANT
PAD DEFIB RADIO PHYSIO CONN (PAD) ×2 IMPLANT
PATCH CARTO3 (PAD) ×1 IMPLANT
SHEATH PINNACLE 7F 10CM (SHEATH) ×2 IMPLANT
SHEATH PINNACLE 9F 10CM (SHEATH) ×1 IMPLANT
SHEATH PROBE COVER 6X72 (BAG) ×1 IMPLANT
SHEATH SWARTZ TS SL2 63CM 8.5F (SHEATH) ×1 IMPLANT
TUBING SMART ABLATE COOLFLOW (TUBING) ×1 IMPLANT

## 2021-08-22 NOTE — Discharge Instructions (Signed)

## 2021-08-22 NOTE — Interval H&P Note (Signed)
History and Physical Interval Note:  08/22/2021 7:03 AM  Calvin Collins  has presented today for surgery, with the diagnosis of afib.  The various methods of treatment have been discussed with the patient and family. After consideration of risks, benefits and other options for treatment, the patient has consented to  Procedure(s): ATRIAL FIBRILLATION ABLATION (N/A) as a surgical intervention.  The patient's history has been reviewed, patient examined, no change in status, stable for surgery.  I have reviewed the patient's chart and labs.  Questions were answered to the patient's satisfaction.    Risk, benefits, and alternatives to EP study and radiofrequency ablation for afib were again discussed in detail today. These risks include but are not limited to stroke, bleeding, vascular damage, tamponade, perforation, damage to the esophagus, lungs, and other structures, pulmonary vein stenosis, worsening renal function, and death. The patient understands these risk and wishes to proceed.    Cardiac CT reviewed at length with the patient today.  he reports compliance with [place] without interruption.  Thompson Grayer MD, [facility] 08/22/2021 7:03 AM    Thompson Grayer

## 2021-08-22 NOTE — Progress Notes (Signed)
On dc pt stated his face wasn't like this when he came in. Pt had a petechia type markings on face when he returned to me but I thought he came to  short stay this way. He did not mention/ nor family at all till time of discharge, he did not want me to call the doctor nor anesthesia. No itching, not raised, does not hurt, looks like little red spots on bilateral face checks.

## 2021-08-22 NOTE — Transfer of Care (Signed)
Immediate Anesthesia Transfer of Care Note  Patient: Calvin Collins  Procedure(s) Performed: ATRIAL FIBRILLATION ABLATION  Patient Location: PACU  Anesthesia Type:General  Level of Consciousness: drowsy and patient cooperative  Airway & Oxygen Therapy: Patient Spontanous Breathing and Patient connected to nasal cannula oxygen  Post-op Assessment: Report given to RN and Post -op Vital signs reviewed and stable  Post vital signs: Reviewed and stable  Last Vitals:  Vitals Value Taken Time  BP 114/86 08/22/21 1019  Temp    Pulse 73 08/22/21 1024  Resp 17 08/22/21 1024  SpO2 94 % 08/22/21 1024  Vitals shown include unvalidated device data.  Last Pain:  Vitals:   08/22/21 0542  TempSrc:   PainSc: 0-No pain         Complications: No notable events documented.

## 2021-08-22 NOTE — Anesthesia Procedure Notes (Signed)
Procedure Name: Intubation Date/Time: 08/22/2021 7:41 AM Performed by: Georgia Duff, CRNA Pre-anesthesia Checklist: Patient identified, Emergency Drugs available, Suction available and Patient being monitored Patient Re-evaluated:Patient Re-evaluated prior to induction Oxygen Delivery Method: Circle System Utilized Preoxygenation: Pre-oxygenation with 100% oxygen Induction Type: IV induction Ventilation: Mask ventilation without difficulty Laryngoscope Size: Miller and 3 Grade View: Grade I Tube type: Oral Tube size: 7.5 mm Number of attempts: 1 Airway Equipment and Method: Stylet and Oral airway Placement Confirmation: ETT inserted through vocal cords under direct vision, positive ETCO2 and breath sounds checked- equal and bilateral Secured at: 22 cm Tube secured with: Tape Dental Injury: Teeth and Oropharynx as per pre-operative assessment

## 2021-08-22 NOTE — Anesthesia Preprocedure Evaluation (Signed)
Anesthesia Evaluation  Patient identified by MRN, date of birth, ID band Patient awake    Reviewed: Allergy & Precautions, H&P , NPO status , Patient's Chart, lab work & pertinent test results  Airway Mallampati: II   Neck ROM: full    Dental   Pulmonary asthma ,    breath sounds clear to auscultation       Cardiovascular hypertension, + dysrhythmias Atrial Fibrillation  Rhythm:irregular Rate:Normal     Neuro/Psych PSYCHIATRIC DISORDERS Anxiety    GI/Hepatic GERD  ,  Endo/Other    Renal/GU      Musculoskeletal  (+) Arthritis ,   Abdominal   Peds  Hematology   Anesthesia Other Findings   Reproductive/Obstetrics                             Anesthesia Physical Anesthesia Plan  ASA: 3  Anesthesia Plan: General   Post-op Pain Management:    Induction: Intravenous  PONV Risk Score and Plan: 2 and Ondansetron, Dexamethasone, Midazolam and Treatment may vary due to age or medical condition  Airway Management Planned: Oral ETT  Additional Equipment:   Intra-op Plan:   Post-operative Plan: Extubation in OR  Informed Consent: I have reviewed the patients History and Physical, chart, labs and discussed the procedure including the risks, benefits and alternatives for the proposed anesthesia with the patient or authorized representative who has indicated his/her understanding and acceptance.     Dental advisory given  Plan Discussed with: CRNA, Anesthesiologist and Surgeon  Anesthesia Plan Comments:         Anesthesia Quick Evaluation

## 2021-08-23 ENCOUNTER — Encounter (HOSPITAL_COMMUNITY): Payer: Self-pay | Admitting: Internal Medicine

## 2021-08-24 NOTE — Anesthesia Postprocedure Evaluation (Signed)
Anesthesia Post Note  Patient: Calvin Collins  Procedure(s) Performed: ATRIAL FIBRILLATION ABLATION     Patient location during evaluation: Cath Lab Anesthesia Type: General Level of consciousness: awake and alert Pain management: pain level controlled Vital Signs Assessment: post-procedure vital signs reviewed and stable Respiratory status: spontaneous breathing, nonlabored ventilation, respiratory function stable and patient connected to nasal cannula oxygen Cardiovascular status: blood pressure returned to baseline and stable Postop Assessment: no apparent nausea or vomiting Anesthetic complications: no   No notable events documented.  Last Vitals:  Vitals:   08/22/21 1115 08/22/21 1230  BP: 124/84 107/65  Pulse: 80 85  Resp: 19 14  Temp:    SpO2: 98% 96%    Last Pain:  Vitals:   08/22/21 1115  TempSrc:   PainSc: 0-No pain                 Clerance Umland S

## 2021-09-24 ENCOUNTER — Encounter (HOSPITAL_COMMUNITY): Payer: Self-pay | Admitting: Nurse Practitioner

## 2021-09-24 ENCOUNTER — Ambulatory Visit (HOSPITAL_COMMUNITY)
Admission: RE | Admit: 2021-09-24 | Discharge: 2021-09-24 | Disposition: A | Discharge status: Home / Self Care | Payer: BC Managed Care – PPO | Source: Ambulatory Visit | Attending: Nurse Practitioner | Admitting: Nurse Practitioner

## 2021-09-24 ENCOUNTER — Other Ambulatory Visit: Payer: Self-pay

## 2021-09-24 VITALS — BP 170/98 | HR 56 | Ht 72.0 in | Wt 267.0 lb

## 2021-09-24 DIAGNOSIS — I491 Atrial premature depolarization: Secondary | ICD-10-CM | POA: Insufficient documentation

## 2021-09-24 DIAGNOSIS — I493 Ventricular premature depolarization: Secondary | ICD-10-CM | POA: Diagnosis not present

## 2021-09-24 DIAGNOSIS — D6869 Other thrombophilia: Secondary | ICD-10-CM | POA: Diagnosis not present

## 2021-09-24 DIAGNOSIS — I4891 Unspecified atrial fibrillation: Secondary | ICD-10-CM | POA: Insufficient documentation

## 2021-09-24 DIAGNOSIS — I4811 Longstanding persistent atrial fibrillation: Secondary | ICD-10-CM

## 2021-09-24 DIAGNOSIS — Z7901 Long term (current) use of anticoagulants: Secondary | ICD-10-CM | POA: Insufficient documentation

## 2021-09-24 DIAGNOSIS — Z79899 Other long term (current) drug therapy: Secondary | ICD-10-CM | POA: Diagnosis not present

## 2021-09-24 NOTE — Progress Notes (Signed)
Primary Care Physician: Harlan Stains, MD Referring Physician: Dr. Posey Pronto Calvin Collins is a 66 y.o. male with a h/o afib that is in the afib clinic 09/24/21 s/p  afib ablation one month ago. He reports that he has not noted any afib. No swallowing or groin issues.    Today, he denies symptoms of palpitations, chest pain, shortness of breath, orthopnea, PND, lower extremity edema, dizziness, presyncope, syncope, or neurologic sequela. The patient is tolerating medications without difficulties and is otherwise without complaint today.   Past Medical History:  Diagnosis Date   Anxiety    Asthmatic bronchitis    ED (erectile dysfunction)    GERD (gastroesophageal reflux disease)    History of colon polyps    Hypercholesteremia    Hypertension    Obesity    Persistent atrial fibrillation ([place]) 02/02/2018   Squamous cell carcinoma of skin    SVT (supraventricular tachycardia) ([place])    Past Surgical History:  Procedure Laterality Date   ATRIAL FIBRILLATION ABLATION N/A 08/22/2021   Procedure: ATRIAL FIBRILLATION ABLATION;  Surgeon: Thompson Grayer, MD;  Location: [facility];  Service: Cardiovascular;  Laterality: N/A;   CARDIOVERSION N/A 03/02/2018   Procedure: CARDIOVERSION;  Surgeon: Jerline Pain, MD;  Location: [facility];  Service: Cardiovascular;  Laterality: N/A;   left arm surgery     skin cancer removed     WISDOM TOOTH EXTRACTION      Current Outpatient Medications  Medication Sig Dispense Refill   albuterol (PROVENTIL HFA;VENTOLIN HFA) 108 (90 Base) MCG/ACT inhaler Inhale into the lungs every 6 (six) hours as needed for wheezing or shortness of breath.     budesonide (PULMICORT) 180 MCG/ACT inhaler Inhale 1 puff into the lungs daily.     DULoxetine (CYMBALTA) 20 MG capsule Take 20 mg by mouth daily as needed (nerve pain).     ELIQUIS 5 MG TABS tablet TAKE 1 TABLET BY MOUTH TWICE DAILY. APPOINTMENT REQUIRED FOR FURTHER REFILLS 180 tablet 3   LORazepam  (ATIVAN) 1 MG tablet TAKE 1 TABLET(1 MG) BY MOUTH EVERY 8 HOURS AS NEEDED FOR ANXIETY 90 tablet 0   nebivolol (BYSTOLIC) 5 MG tablet Take 1 tablet (5 mg total) by mouth daily. 90 tablet 3   pantoprazole (PROTONIX) 40 MG tablet Take 1 tablet (40 mg total) by mouth daily. 45 tablet 0   No current facility-administered medications for this encounter.    Allergies  Allergen Reactions   Flecainide Shortness Of Breath   Paroxetine Other (See Comments)    Caused increase in weight gain    Social History   Socioeconomic History   Marital status: Married    Spouse name: Not on file   Number of children: 4   Years of education: Not on file   Highest education level: Not on file  Occupational History   Occupation: College Professor  Tobacco Use   Smoking status: Never   Smokeless tobacco: Never  Vaping Use   Vaping Use: Never used  Substance and Sexual Activity   Alcohol use: Not Currently   Drug use: No   Sexual activity: Not on file  Other Topics Concern   Not on file  Social History Narrative   Lives in [place].  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  Attends [employer]   Social Determinants of Health   Financial Resource Strain: Not on file  Food Insecurity: Not on file  Transportation Needs: Not on file  Physical Activity:  Not on file  Stress: Not on file  Social Connections: Not on file  Intimate Partner Violence: Not on file    Family History  Problem Relation Age of Onset   Emphysema Father    Hypertension Father    Emphysema Paternal Uncle    Other Daughter        liver transplant   Hypertension Mother    Stroke Mother    Cancer Brother    Skin cancer Brother        basal cell   Skin cancer Sister        basal cell    ROS- All systems are reviewed and negative except as per the HPI above  Physical Exam: Vitals:   09/24/21 1347  BP: (!) 170/98  Pulse: (!) 56  Weight: 121.1 kg  Height: 6' (1.829 m)   Wt Readings from Last 3  Encounters:  09/24/21 121.1 kg  08/22/21 120.2 kg  08/07/21 120.7 kg    Labs: Lab Results  Component Value Date   NA 140 08/07/2021   K 4.6 08/07/2021   CL 106 08/07/2021   CO2 26 08/07/2021   GLUCOSE 91 08/07/2021   BUN 15 08/07/2021   CREATININE 0.97 08/07/2021   CALCIUM 9.4 08/07/2021   No results found for: INR No results found for: CHOL, HDL, LDLCALC, TRIG   GEN- The patient is well appearing, alert and oriented x 3 today.   Head- normocephalic, atraumatic Eyes-  Sclera clear, conjunctiva pink Ears- hearing intact Oropharynx- clear Neck- supple, no JVP Lymph- no cervical lymphadenopathy Lungs- Clear to ausculation bilaterally, normal work of breathing Heart- Regular rate and rhythm, no murmurs, rubs or gallops, PMI not laterally displaced GI- soft, NT, ND, + BS Extremities- no clubbing, cyanosis, or edema MS- no significant deformity or atrophy Skin- no rash or lesion Psych- euthymic mood, full affect Neuro- strength and sensation are intact  EKG-Sinus brady at 56 bpm, with 2 PC's noted. Pr int 144 ms, qrs int 82 ms, qtc 411 ms     Assessment and Plan:  1. Afib S/p ablation one month ago  and is doing well staying in SR No swallowing or groin issues Continue nebivolol 5 mg bid   2. CHA2DS2VASc score of 1 Continue eliquis 5 mg bid   F/u with Dr. Rayann Heman in 2 months   Geroge Baseman. Ailani Governale, [facility] [address] [place], [place] [address] [phone]

## 2021-11-02 ENCOUNTER — Telehealth: Payer: Self-pay | Admitting: Cardiology

## 2021-11-02 NOTE — Telephone Encounter (Signed)
Patient called in reporting an episode of tachycardia which happened about 30 minutes prior to call. Noted palpitations and checked his apple watch. HR 95-107, did report most recently at NSR. Asked that patient log any further episodes to determine if he is having break through Afib. Continue on home medications without adjustments. ER precautions given. Thanked me for follow up call

## 2021-11-28 ENCOUNTER — Other Ambulatory Visit: Payer: Self-pay

## 2021-11-28 ENCOUNTER — Encounter: Payer: Self-pay | Admitting: Internal Medicine

## 2021-11-28 ENCOUNTER — Ambulatory Visit: Payer: BC Managed Care – PPO | Admitting: Internal Medicine

## 2021-11-28 VITALS — BP 136/80 | HR 54 | Ht 72.0 in | Wt 270.0 lb

## 2021-11-28 DIAGNOSIS — D6869 Other thrombophilia: Secondary | ICD-10-CM | POA: Diagnosis not present

## 2021-11-28 DIAGNOSIS — I10 Essential (primary) hypertension: Secondary | ICD-10-CM | POA: Diagnosis not present

## 2021-11-28 DIAGNOSIS — I480 Paroxysmal atrial fibrillation: Secondary | ICD-10-CM | POA: Diagnosis not present

## 2021-11-28 NOTE — Patient Instructions (Addendum)
Medication Instructions:  ?Your physician recommends that you continue on your current medications as directed. Please refer to the Current Medication list given to you today. ?*If you need a refill on your cardiac medications before your next appointment, please call your pharmacy* ? ?Lab Work: ?None ordered. ?If you have labs (blood work) drawn today and your tests are completely normal, you will receive your results only by: ?MyChart Message (if you have MyChart) OR ?A paper copy in the mail ?If you have any lab test that is abnormal or we need to change your treatment, we will call you to review the results. ? ?Testing/Procedures: ?None ordered. ? ?Follow-Up: ?At [facility], you and your health needs are our priority.  As part of our continuing mission to provide you with exceptional heart care, we have created designated Provider Care Teams.  These Care Teams include your primary Cardiologist (physician) and Advanced Practice Providers (APPs -  Physician Assistants and Nurse Practitioners) who all work together to provide you with the care you need, when you need it. ? ?Your next appointment:   ?Your physician wants you to follow-up in: 6 months with Dr. Rayann Heman. ?You will receive a reminder letter in the mail two months in advance. If you don't receive a letter, please call our office to schedule the follow-up appointment. ? ?

## 2021-11-28 NOTE — Progress Notes (Signed)
? ?PCP: Harlan Stains, MD ? ?Calvin Collins is a 66 y.o. male who presents today for routine electrophysiology followup.  Since his recent afib ablation, the patient reports doing very well.  he denies procedure related complications and is pleased with the results of the procedure.  Today, he denies symptoms of palpitations, chest pain, shortness of breath,  lower extremity edema, dizziness, presyncope, or syncope.  The patient is otherwise without complaint today.  ? ?Past Medical History:  ?Diagnosis Date  ? Anxiety   ? Asthmatic bronchitis   ? ED (erectile dysfunction)   ? GERD (gastroesophageal reflux disease)   ? History of colon polyps   ? Hypercholesteremia   ? Hypertension   ? Obesity   ? Persistent atrial fibrillation ([place]) 02/02/2018  ? Squamous cell carcinoma of skin   ? SVT (supraventricular tachycardia) ([place])   ? ?Past Surgical History:  ?Procedure Laterality Date  ? ATRIAL FIBRILLATION ABLATION N/A 08/22/2021  ? Procedure: ATRIAL FIBRILLATION ABLATION;  Surgeon: Thompson Grayer, MD;  Location: [facility];  Service: Cardiovascular;  Laterality: N/A;  ? CARDIOVERSION N/A 03/02/2018  ? Procedure: CARDIOVERSION;  Surgeon: Jerline Pain, MD;  Location: [facility];  Service: Cardiovascular;  Laterality: N/A;  ? left arm surgery    ? skin cancer removed    ? WISDOM TOOTH EXTRACTION    ? ? ?ROS- all systems are personally reviewed and negatives except as per HPI above ? ?Current Outpatient Medications  ?Medication Sig Dispense Refill  ? albuterol (PROVENTIL HFA;VENTOLIN HFA) 108 (90 Base) MCG/ACT inhaler Inhale into the lungs every 6 (six) hours as needed for wheezing or shortness of breath.    ? budesonide (PULMICORT) 180 MCG/ACT inhaler Inhale 1 puff into the lungs daily.    ? DULoxetine (CYMBALTA) 20 MG capsule Take 20 mg by mouth daily as needed (nerve pain).    ? ELIQUIS 5 MG TABS tablet TAKE 1 TABLET BY MOUTH TWICE DAILY. APPOINTMENT REQUIRED FOR FURTHER REFILLS 180 tablet 3  ? LORazepam  (ATIVAN) 1 MG tablet TAKE 1 TABLET(1 MG) BY MOUTH EVERY 8 HOURS AS NEEDED FOR ANXIETY 90 tablet 0  ? nebivolol (BYSTOLIC) 5 MG tablet Take 1 tablet (5 mg total) by mouth daily. 90 tablet 3  ? ?No current facility-administered medications for this visit.  ? ? ?Physical Exam: ?Vitals:  ? 11/28/21 1431  ?BP: 136/80  ?Pulse: (!) 54  ?SpO2: 97%  ?Weight: 270 lb (122.5 kg)  ?Height: 6' (1.829 m)  ? ? ?GEN- The patient is well appearing, alert and oriented x 3 today.   ?Head- normocephalic, atraumatic ?Eyes-  Sclera clear, conjunctiva pink ?Ears- hearing intact ?Oropharynx- clear ?Lungs- Clear to ausculation bilaterally, normal work of breathing ?Heart- Regular rate and rhythm, no murmurs, rubs or gallops, PMI not laterally displaced ?GI- soft, NT, ND, + BS ?Extremities- no clubbing, cyanosis, or edema ? ?EKG tracing ordered today is personally reviewed and shows sinus ? ?Assessment and Plan: ? ?1. Persistent atrial fibrillation ?Doing well s/p ablation ?He feels "much better" in sinus. ?chads2vasc score is 1.  We discussed pros and cons of eliquis.  His chads2vasc score will be 2 in January.  For now he wishes to continue [place].  He will contact my office if he changes his mind.  I will add him to the list for patients to consider for REACT AF trial. ? ?2. HTN ?Stable ?No change required today ? ?3. Obesity ?Body mass index is 36.62 kg/m?. ?Lifestyle modification is advised ? ? ?  Return to see me in 6 months ? ?Thompson Grayer MD, FACC ?11/28/2021 ?2:38 PM ? ? ? ? ?

## 2022-04-24 ENCOUNTER — Other Ambulatory Visit: Payer: Self-pay | Admitting: Internal Medicine

## 2022-05-19 ENCOUNTER — Ambulatory Visit: Payer: BC Managed Care – PPO | Admitting: Internal Medicine

## 2022-05-22 ENCOUNTER — Other Ambulatory Visit: Payer: Self-pay | Admitting: Internal Medicine

## 2022-05-26 NOTE — Progress Notes (Signed)
PCP:  Harlan Stains, MD Primary Cardiologist: None Electrophysiologist: Dr. Rayann Heman ->  Melida Quitter, MD  Calvin Collins is a 66 y.o. male seen today for Melida Quitter, MD (previously seen by Dr. Rayann Heman) for routine electrophysiology followup. Since last being seen in our clinic the patient reports doing very well. He has not felt any breakthrough AF or palpitations.  he denies chest pain, dyspnea, PND, orthopnea, nausea, vomiting, dizziness, syncope, edema, weight gain, or early satiety.   Past Medical History:  Diagnosis Date   Anxiety    Asthmatic bronchitis    ED (erectile dysfunction)    GERD (gastroesophageal reflux disease)    History of colon polyps    Hypercholesteremia    Hypertension    Obesity    Persistent atrial fibrillation ([place]) 02/02/2018   Squamous cell carcinoma of skin    SVT (supraventricular tachycardia) ([place])    Past Surgical History:  Procedure Laterality Date   ATRIAL FIBRILLATION ABLATION N/A 08/22/2021   Procedure: ATRIAL FIBRILLATION ABLATION;  Surgeon: Thompson Grayer, MD;  Location: [facility];  Service: Cardiovascular;  Laterality: N/A;   CARDIOVERSION N/A 03/02/2018   Procedure: CARDIOVERSION;  Surgeon: Jerline Pain, MD;  Location: [facility];  Service: Cardiovascular;  Laterality: N/A;   left arm surgery     skin cancer removed     WISDOM TOOTH EXTRACTION      Current Outpatient Medications  Medication Sig Dispense Refill   albuterol (PROVENTIL HFA;VENTOLIN HFA) 108 (90 Base) MCG/ACT inhaler Inhale into the lungs every 6 (six) hours as needed for wheezing or shortness of breath.     budesonide (PULMICORT) 180 MCG/ACT inhaler Inhale 1 puff into the lungs daily.     DULoxetine (CYMBALTA) 20 MG capsule Take 20 mg by mouth daily as needed (nerve pain).     ELIQUIS 5 MG TABS tablet TAKE 1 TABLET BY MOUTH TWICE DAILY. APPOINTMENT REQUIRED FOR FURTHER REFILLS 180 tablet 3   LORazepam (ATIVAN) 1 MG tablet TAKE 1 TABLET(1 MG) BY  MOUTH EVERY 8 HOURS AS NEEDED FOR ANXIETY 90 tablet 0   nebivolol (BYSTOLIC) 5 MG tablet Take 1 tablet (5 mg total) by mouth daily. Please keep upcoming appointment for future refills. Thank you. 30 tablet 0   No current facility-administered medications for this visit.    Allergies  Allergen Reactions   Flecainide Shortness Of Breath    Social History   Socioeconomic History   Marital status: Married    Spouse name: Not on file   Number of children: 4   Years of education: Not on file   Highest education level: Not on file  Occupational History   Occupation: College Professor  Tobacco Use   Smoking status: Never   Smokeless tobacco: Never  Vaping Use   Vaping Use: Never used  Substance and Sexual Activity   Alcohol use: Not Currently   Drug use: No   Sexual activity: Not on file  Other Topics Concern   Not on file  Social History Narrative   Lives in [place].  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  Attends [employer]   Social Determinants of Health   Financial Resource Strain: Not on file  Food Insecurity: Not on file  Transportation Needs: Not on file  Physical Activity: Not on file  Stress: Not on file  Social Connections: Not on file  Intimate Partner Violence: Not on file     Review of Systems: All other systems reviewed and  are otherwise negative except as noted above.  Physical Exam: Vitals:   06/02/22 1148  BP: 132/82  Pulse: (!) 51  SpO2: 98%  Weight: 266 lb (120.7 kg)  Height: 6' (1.829 m)    GEN- The patient is well appearing, alert and oriented x 3 today.   HEENT: normocephalic, atraumatic; sclera clear, conjunctiva pink; hearing intact; oropharynx clear; neck supple, no JVP Lymph- no cervical lymphadenopathy Lungs- Clear to ausculation bilaterally, normal work of breathing.  No wheezes, rales, rhonchi Heart- Regular rate and rhythm, no murmurs, rubs or gallops, PMI not laterally displaced GI- soft, non-tender,  non-distended, bowel sounds present, no hepatosplenomegaly Extremities- No peripheral edema. no clubbing or cyanosis; DP/PT/radial pulses 2+ bilaterally MS- no significant deformity or atrophy Skin- warm and dry, no rash or lesion Psych- euthymic mood, full affect Neuro- strength and sensation are intact  EKG is ordered. Personal review of EKG from today shows NSR/sinus brady at 52 bpm  Additional studies reviewed include: Previous EP office notes.   Assessment and Plan:  1. Persistent atrial fibrillation S/p ablation 08/2021 Has not had any further to his knowledge.  He wishes to remain on Eliquis, and his chads2vasc score will be 2 in January.  Would not recommend stopping.  Discussed REACT AF trial but he would prefer no change for now.    2. HTN Stable on current regimen    3. Obesity Body mass index is 36.08 kg/m.   Follow up with Dr. Myles Gip in 6 months  Shirley Friar, [place]  06/02/22 12:08 PM

## 2022-06-02 ENCOUNTER — Ambulatory Visit: Payer: BC Managed Care – PPO | Attending: Student | Admitting: Student

## 2022-06-02 VITALS — BP 132/82 | HR 51 | Ht 72.0 in | Wt 266.0 lb

## 2022-06-02 DIAGNOSIS — I480 Paroxysmal atrial fibrillation: Secondary | ICD-10-CM

## 2022-06-02 DIAGNOSIS — D6869 Other thrombophilia: Secondary | ICD-10-CM | POA: Diagnosis not present

## 2022-06-02 DIAGNOSIS — I10 Essential (primary) hypertension: Secondary | ICD-10-CM | POA: Diagnosis not present

## 2022-06-02 MED ORDER — APIXABAN 5 MG PO TABS: ORAL_TABLET | ORAL | 3 refills | Status: DC

## 2022-06-02 MED ORDER — NEBIVOLOL HCL 5 MG PO TABS: 5.0000 mg | ORAL_TABLET | Freq: Every day | ORAL | 3 refills | Status: DC

## 2022-06-02 NOTE — Patient Instructions (Signed)
Medication Instructions:  Your physician recommends that you continue on your current medications as directed. Please refer to the Current Medication list given to you today.  *If you need a refill on your cardiac medications before your next appointment, please call your pharmacy*   Lab Work: None If you have labs (blood work) drawn today and your tests are completely normal, you will receive your results only by: [place] (if you have MyChart) OR A paper copy in the mail If you have any lab test that is abnormal or we need to change your treatment, we will call you to review the results.   Follow-Up: At [facility], you and your health needs are our priority.  As part of our continuing mission to provide you with exceptional heart care, we have created designated Provider Care Teams.  These Care Teams include your primary Cardiologist (physician) and Advanced Practice Providers (APPs -  Physician Assistants and Nurse Practitioners) who all work together to provide you with the care you need, when you need it.  Your next appointment:   6 month(s)  The format for your next appointment:   In Person  Provider:   Doralee Albino, MD

## 2022-06-22 ENCOUNTER — Other Ambulatory Visit: Payer: Self-pay | Admitting: Internal Medicine

## 2022-10-16 ENCOUNTER — Encounter (HOSPITAL_COMMUNITY): Payer: Self-pay | Admitting: *Deleted

## 2022-11-03 ENCOUNTER — Telehealth: Payer: Self-pay | Admitting: Physician Assistant

## 2022-11-03 NOTE — Telephone Encounter (Signed)
Patient c/o Palpitations:  High priority if patient c/o lightheadedness, shortness of breath, or chest pain  How long have you had palpitations/irregular HR/ Afib? Are you having the symptoms now? Yes   Are you currently experiencing lightheadedness, SOB or CP?   Do you have a history of afib (atrial fibrillation) or irregular heart rhythm? Yes   Have you checked your BP or HR? (document readings if available): HR is at 128  Are you experiencing any other symptoms? stress

## 2022-11-03 NOTE — Telephone Encounter (Signed)
Pt called back per message received in [place] Triage.  Pt stated he had an Afib Ablation 4 to 5 years ago, and has been in NSR for a long time.  Recently, last Saturday, per his watch, he was in light Afib for 15 to 20 minutes, then went back to NSR.   Pt then went into heavy Afib with HR between 125 to 130, around 115 pm today, 2/26.  Pt was concerned, as this is the fastest Afib he has been in since the ablation. Pt states his normal HR is between 50 to 55 bpm.  At the time of our conversation Pt HR was 75 bpm.    Pt last saw Mr. Tillery PA-C on 06/02/2022.  Pt wants to see an EP provider to discuss the return of his Afib, and see if an intervention or medication can help him return / stay in NSR.  Pt scheduled with Ms. Tommye Standard PA-C to discuss treatment options, and POC.  Pt was thankful to see Ms. Renee PA-C, and appreciated being added to her schedule on 11/05/2022 at 800 am. Pt educated on normal HR range, and what Afib is.  Pt advised to call [facility] if his Afib returns and does not return to normal HR range.  If this happens after hours, and HR does not slow down for long period of time / becomes symptomatic, Pt advised to go to nearest ER for care.  Pt understood instructions.

## 2022-11-04 NOTE — Progress Notes (Unsigned)
Cardiology Office Note Date:  11/04/2022  Patient ID:  Calvin Collins, DOB 22-Aug-1956, MRN [id number] PCP:  Harlan Stains, MD  Electrophysiologist: Dr. Rayann Heman > Dr. Myles Gip  ***refresh   Chief Complaint: *** recurrent AFib  History of Present Illness: Calvin Collins is a 67 y.o. male with history of HTN, HLD, AFib  He saw Dr. Rayann Heman 11/28/21, post ablation , feeling well, clearly feeling better in SR, maintained on [place] soon to have a risk score of 2.  No changes were made.  He saw A. Tillery, PA-C 06/02/22, noted plan to transition to Dr. Myles Gip. Pt was feeling well, no changes were made.  Pt called yesterday with recurrent AFib episodes, the 1st couple sine his ablation.  *** eliquis, dose, compliance, labs, bleeding *** AAD? BB, repeat ablation *** trigger *** + Coronary Ca++ *** ??? Apnea  AFib/AAD hx Diagnosed 2019 PVI ablation 08/22/21 (Dr. Rayann Heman) No AAD to date  Past Medical History:  Diagnosis Date   Anxiety    Asthmatic bronchitis    ED (erectile dysfunction)    GERD (gastroesophageal reflux disease)    History of colon polyps    Hypercholesteremia    Hypertension    Obesity    Persistent atrial fibrillation ([place]) 02/02/2018   Squamous cell carcinoma of skin    SVT (supraventricular tachycardia) ([place])     Past Surgical History:  Procedure Laterality Date   ATRIAL FIBRILLATION ABLATION N/A 08/22/2021   Procedure: ATRIAL FIBRILLATION ABLATION;  Surgeon: Thompson Grayer, MD;  Location: [facility];  Service: Cardiovascular;  Laterality: N/A;   CARDIOVERSION N/A 03/02/2018   Procedure: CARDIOVERSION;  Surgeon: Jerline Pain, MD;  Location: [facility];  Service: Cardiovascular;  Laterality: N/A;   left arm surgery     skin cancer removed     WISDOM TOOTH EXTRACTION      Current Outpatient Medications  Medication Sig Dispense Refill   albuterol (PROVENTIL HFA;VENTOLIN HFA) 108 (90 Base) MCG/ACT inhaler Inhale into the lungs every 6 (six) hours as  needed for wheezing or shortness of breath.     apixaban (ELIQUIS) 5 MG TABS tablet TAKE 1 TABLET BY MOUTH TWICE DAILY 180 tablet 3   budesonide (PULMICORT) 180 MCG/ACT inhaler Inhale 1 puff into the lungs daily.     DULoxetine (CYMBALTA) 20 MG capsule Take 20 mg by mouth daily as needed (nerve pain).     LORazepam (ATIVAN) 1 MG tablet TAKE 1 TABLET(1 MG) BY MOUTH EVERY 8 HOURS AS NEEDED FOR ANXIETY 90 tablet 0   nebivolol (BYSTOLIC) 5 MG tablet Take 1 tablet (5 mg total) by mouth daily. 90 tablet 3   No current facility-administered medications for this visit.    Allergies:   Flecainide   Social History:  The patient  reports that he has never smoked. He has never used smokeless tobacco. He reports that he does not currently use alcohol. He reports that he does not use drugs.   Family History:  The patient's family history includes Cancer in his brother; Emphysema in his father and paternal uncle; Hypertension in his father and mother; Other in his daughter; Skin cancer in his brother and sister; Stroke in his mother.  ROS:  Please see the history of present illness.    All other systems are reviewed and otherwise negative.   PHYSICAL EXAM:  VS:  There were no vitals taken for this visit. BMI: There is no height or weight on file to calculate BMI. Well nourished, well developed,  in no acute distress HEENT: normocephalic, atraumatic Neck: no JVD, carotid bruits or masses Cardiac:  *** RRR; no significant murmurs, no rubs, or gallops Lungs:  *** CTA b/l, no wheezing, rhonchi or rales Abd: soft, nontender MS: no deformity or *** atrophy Ext: *** no edema Skin: warm and dry, no rash Neuro:  No gross deficits appreciated Psych: euthymic mood, full affect    EKG:  Done today and reviewed by myself shows  ***   08/22/2021: EPS/ablation CONCLUSIONS: 1. Atrial fibrillation upon presentation.   2. Intracardiac echo reveals a moderate sized left atrium with four separate pulmonary  veins without evidence of pulmonary vein stenosis. 3. Successful electrical isolation and anatomical encircling of all four pulmonary veins with radiofrequency current.  A WACA approach was used 3. Additional left atrial ablation was performed with a standard box lesion created along the posterior wall of the left atrium 4. Atrial fibrillation successfully cardioverted to sinus rhythm. 5. No early apparent complications.   08/15/21: cardiac CT IMPRESSION: 1. There is normal pulmonary vein drainage into the left atrium with ostial measurements above. 2. There is no thrombus in the left atrial appendage. 3. The esophagus runs in the left atrial midline to left side and is in proximity to the left upper and the left lower pulmonary vein ostia 4. No PFO/ASD. 5. Normal coronary origin. Right dominance. 6. CAC score of 516 which is 86th percentile for age-, race-, and sex-matched controls.   05/03/20: TTE 1. Left ventricular ejection fraction, by estimation, is 60 to 65%. The  left ventricle has normal function. The left ventricle has no regional  wall motion abnormalities. Left ventricular diastolic parameters are  indeterminate.   2. Right ventricular systolic function is normal. The right ventricular  size is normal. There is normal pulmonary artery systolic pressure. The  estimated right ventricular systolic pressure is 123456 mmHg.   3. The mitral valve is normal in structure. Trivial mitral valve  regurgitation. No evidence of mitral stenosis.   4. The aortic valve is normal in structure. Aortic valve regurgitation is  not visualized. No aortic stenosis is present.   5. Aortic dilatation noted. There is mild dilatation of the aortic root  and of the ascending aorta measuring 38 mm and 59m respectively.   6. The inferior vena cava is normal in size with greater than 50%  respiratory variability, suggesting right atrial pressure of 3 mmHg.   Comparison(s): 05/12/18 EF 50-55%.    Recent Labs: No results found for requested labs within last 365 days.  No results found for requested labs within last 365 days.   CrCl cannot be calculated (Patient's most recent lab result is older than the maximum 21 days allowed.).   Wt Readings from Last 3 Encounters:  06/02/22 266 lb (120.7 kg)  11/28/21 270 lb (122.5 kg)  09/24/21 267 lb (121.1 kg)     Other studies reviewed: Additional studies/records reviewed today include: summarized above  ASSESSMENT AND PLAN:  Paroxysmal Afib CHA2DS2Vasc is 2, on Eliquis, *** appropriately dosed ***  HTN ***  Obesity ***  Disposition: F/u with ***  Current medicines are reviewed at length with the patient today.  The patient did not have any concerns regarding medicines.  SVenetia Night PA-C 11/04/2022 7:18 AM     [facility] [address] [place] [phone](office)  [phone](fax)

## 2022-11-05 ENCOUNTER — Ambulatory Visit: Payer: BC Managed Care – PPO | Attending: Physician Assistant | Admitting: Physician Assistant

## 2022-11-05 ENCOUNTER — Encounter: Payer: Self-pay | Admitting: Physician Assistant

## 2022-11-05 VITALS — BP 150/88 | HR 53 | Ht 72.0 in | Wt 270.4 lb

## 2022-11-05 DIAGNOSIS — I480 Paroxysmal atrial fibrillation: Secondary | ICD-10-CM

## 2022-11-05 DIAGNOSIS — D6869 Other thrombophilia: Secondary | ICD-10-CM

## 2022-11-05 DIAGNOSIS — I4811 Longstanding persistent atrial fibrillation: Secondary | ICD-10-CM | POA: Diagnosis not present

## 2022-11-05 DIAGNOSIS — I10 Essential (primary) hypertension: Secondary | ICD-10-CM

## 2022-11-05 MED ORDER — DILTIAZEM HCL 30 MG PO TABS: 30.0000 mg | ORAL_TABLET | Freq: Every day | ORAL | 0 refills | Status: AC | PRN

## 2022-11-05 NOTE — Patient Instructions (Signed)
  Medication Instructions:   START TAKING:  DILTIAZEM 30 MG AS NEEDED FOR PALPITATIONS / AFIB   *If you need a refill on your cardiac medications before your next appointment, please call your pharmacy*   Lab Work: NONE ORDERED  TODAY    If you have labs (blood work) drawn today and your tests are completely normal, you will receive your results only by: [place] (if you have MyChart) OR A paper copy in the mail If you have any lab test that is abnormal or we need to change your treatment, we will call you to review the results.    Follow-Up: At [facility], you and your health needs are our priority.  As part of our continuing mission to provide you with exceptional heart care, we have created designated Provider Care Teams.  These Care Teams include your primary Cardiologist (physician) and Advanced Practice Providers (APPs -  Physician Assistants and Nurse Practitioners) who all work together to provide you with the care you need, when you need it.  We recommend signing up for the patient portal called "MyChart".  Sign up information is provided on this After Visit Summary.  MyChart is used to connect with patients for Virtual Visits (Telemedicine).  Patients are able to view lab/test results, encounter notes, upcoming appointments, etc.  Non-urgent messages can be sent to your provider as well.   To learn more about what you can do with MyChart, go to [email or web address].    Your next appointment:  NEXT AVAILABLE   Provider:   Doralee Albino, MD    Other Instructions

## 2022-12-23 ENCOUNTER — Ambulatory Visit: Payer: BC Managed Care – PPO | Attending: Cardiovascular Disease | Admitting: Cardiovascular Disease

## 2022-12-23 ENCOUNTER — Encounter: Payer: Self-pay | Admitting: Cardiovascular Disease

## 2022-12-23 VITALS — BP 124/98 | HR 108 | Ht 72.0 in | Wt 269.8 lb

## 2022-12-23 DIAGNOSIS — I480 Paroxysmal atrial fibrillation: Secondary | ICD-10-CM | POA: Diagnosis not present

## 2022-12-23 DIAGNOSIS — R931 Abnormal findings on diagnostic imaging of heart and coronary circulation: Secondary | ICD-10-CM

## 2022-12-23 NOTE — Progress Notes (Signed)
Electrophysiology Office Note:    Date:  12/23/2022   ID:  Calvin Collins, DOB 02-Feb-1956, MRN [id number]  PCP:  Laurann Montana, MD   [facility] Providers Cardiologist:  None Electrophysiologist:  Maurice Small, MD     Referring MD: Laurann Montana, MD   History of Present Illness:    Calvin Collins is a 67 y.o. male with a hx listed below, significant for atrial fibrillation with ablation by Dr. Johney Frame on November 28, 2021, hypertension, hyperlipidemia referred for arrhythmia management.  He noted recurrences of A-fib in February 2022.  He noted an increase of his heart rate from about 50 bpm at baseline to about 100 beats a minute with an irregular rhythm.  He had a few additional episodes lasting no more than 30 minutes.  The most recent episode was about a month ago.  No chest pain, syncope, presyncope.   Past Medical History:  Diagnosis Date   Anxiety    Asthmatic bronchitis    ED (erectile dysfunction)    GERD (gastroesophageal reflux disease)    History of colon polyps    Hypercholesteremia    Hypertension    Obesity    Persistent atrial fibrillation 02/02/2018   Squamous cell carcinoma of skin    SVT (supraventricular tachycardia)     Past Surgical History:  Procedure Laterality Date   ATRIAL FIBRILLATION ABLATION N/A 08/22/2021   Procedure: ATRIAL FIBRILLATION ABLATION;  Surgeon: Hillis Range, MD;  [facility];  Service: Cardiovascular;  Laterality: N/A;   CARDIOVERSION N/A 03/02/2018   Procedure: CARDIOVERSION;  Surgeon: Jake Bathe, MD;  Location: [facility];  Service: Cardiovascular;  Laterality: N/A;   left arm surgery     skin cancer removed     WISDOM TOOTH EXTRACTION      Current Medications: Current Meds  Medication Sig   albuterol (PROVENTIL HFA;VENTOLIN HFA) 108 (90 Base) MCG/ACT inhaler Inhale into the lungs every 6 (six) hours as needed for wheezing or shortness of breath.   apixaban (ELIQUIS) 5 MG TABS tablet  TAKE 1 TABLET BY MOUTH TWICE DAILY   budesonide (PULMICORT) 180 MCG/ACT inhaler Inhale 1 puff into the lungs daily.   diltiazem (CARDIZEM) 30 MG tablet Take 1 tablet (30 mg total) by mouth daily as needed (PALPITATIONS / AFIB).   DULoxetine (CYMBALTA) 20 MG capsule Take 20 mg by mouth daily as needed (nerve pain).   LORazepam (ATIVAN) 1 MG tablet TAKE 1 TABLET(1 MG) BY MOUTH EVERY 8 HOURS AS NEEDED FOR ANXIETY   nebivolol (BYSTOLIC) 5 MG tablet Take 1 tablet (5 mg total) by mouth daily.     Allergies:   Flecainide   Social and Family History: Reviewed in Epic  ROS:   Please see the history of present illness.    All other systems reviewed and are negative.  EKGs/Labs/Other Studies Reviewed Today:    Echocardiogram:    Monitors:   Stress testing:   Advanced imaging:    EKG:  Last EKG results: sinus rhythm 11/05/22   Recent Labs: No results found for requested labs within last 365 days.     Physical Exam:    VS:  BP (!) 124/98   Pulse (!) 108   Ht 6' (1.829 m)   Wt 269 lb 12.8 oz (122.4 kg)   SpO2 98%   BMI 36.59 kg/m     Wt Readings from Last 3 Encounters:  12/23/22 269 lb 12.8 oz (122.4 kg)  11/05/22 270 lb 6.4 oz (122.7  kg)  06/02/22 266 lb (120.7 kg)     GEN:  Well nourished, well developed in no acute distress CARDIAC: RRR, no murmurs, rubs, gallops RESPIRATORY:  Normal work of breathing MUSCULOSKELETAL: no edema    ASSESSMENT & PLAN:    Atrial fibrillation S/p ablation by Dr. Johney Frame 2022 Has had recurrence of subjectively irregular rhythm with rapid rates, likely A-fib, possibly atrial flutter We discussed the indication for repeat study and possible ablation.  He has not had recurrence in over a month and would like to continue monitoring. I have asked him to capture episodes with his watch and send them to [place] should he have recurrence.  Elevated coronary calcium score We discussed the indication for statin, which he has been hesitant to  take He is going to follow-up with his PCP regarding this         Medication Adjustments/Labs and Tests Ordered: Current medicines are reviewed at length with the patient today.  Concerns regarding medicines are outlined above.  No orders of the defined types were placed in this encounter.  No orders of the defined types were placed in this encounter.    Signed, Maurice Small, MD  12/23/2022 9:48 AM    [facility]

## 2022-12-23 NOTE — Patient Instructions (Signed)
Medication Instructions:  .instur *If you need a refill on your cardiac medications before your next appointment, please call your pharmacy*   Follow-Up: At [facility], you and your health needs are our priority.  As part of our continuing mission to provide you with exceptional heart care, we have created designated Provider Care Teams.  These Care Teams include your primary Cardiologist (physician) and Advanced Practice Providers (APPs -  Physician Assistants and Nurse Practitioners) who all work together to provide you with the care you need, when you need it.  We recommend signing up for the patient portal called "MyChart".  Sign up information is provided on this After Visit Summary.  MyChart is used to connect with patients for Virtual Visits (Telemedicine).  Patients are able to view lab/test results, encounter notes, upcoming appointments, etc.  Non-urgent messages can be sent to your provider as well.   To learn more about what you can do with MyChart, go to [email or web address].    Your next appointment:   6 month(s)  Provider:   York Pellant, MD

## 2023-05-27 ENCOUNTER — Other Ambulatory Visit: Payer: Self-pay | Admitting: *Deleted

## 2023-05-27 MED ORDER — APIXABAN 5 MG PO TABS: ORAL_TABLET | ORAL | 1 refills | Status: DC

## 2023-05-27 NOTE — Telephone Encounter (Signed)
Prescription refill request for Eliquis received. Indication: afib  Last office visit: Mealor, 12/23/2022 Scr:  0.99, 11/24/2022 Age: 67 yo Weight: 122.4 kg   Refill sent.

## 2023-07-30 ENCOUNTER — Other Ambulatory Visit: Payer: Self-pay | Admitting: Student

## 2023-08-20 ENCOUNTER — Ambulatory Visit: Payer: BC Managed Care – PPO | Admitting: Cardiovascular Disease

## 2023-09-14 ENCOUNTER — Encounter: Payer: Self-pay | Admitting: Cardiovascular Disease

## 2023-09-14 ENCOUNTER — Ambulatory Visit: Payer: 59 | Attending: Cardiovascular Disease | Admitting: Cardiovascular Disease

## 2023-09-14 VITALS — BP 132/70 | HR 54 | Ht 72.0 in | Wt 272.2 lb

## 2023-09-14 DIAGNOSIS — I4811 Longstanding persistent atrial fibrillation: Secondary | ICD-10-CM | POA: Diagnosis not present

## 2023-09-14 NOTE — Progress Notes (Signed)
  Electrophysiology Office Note:    Date:  09/14/2023   ID:  Kilo Eshelman, DOB 1956-03-14, MRN [id number]  PCP:  Teresa Channel, MD   [facility] Providers Cardiologist:  None Electrophysiologist:  Eulas FORBES Furbish, MD     Referring MD: Teresa Channel, MD   History of Present Illness:    Calvin Collins is a 68 y.o. male with a hx listed below, significant for atrial fibrillation with ablation by Dr. Kelsie on November 28, 2021, hypertension, hyperlipidemia referred for arrhythmia management.  He noted recurrences of A-fib in February 2022.  He noted an increase of his heart rate from about 50 bpm at baseline to about 100 beats a minute with an irregular rhythm.  He had a few additional episodes lasting no more than 30 minutes in February 2024.    Since, he has not had any recurrence of palpitations or symptoms to otherwise suggest recurrence of atrial fibrillation. No chest pain, syncope, presyncope.    EKGs/Labs/Other Studies Reviewed Today:    Echocardiogram:    Monitors:   Stress testing:   Advanced imaging:    EKG:  Last EKG results: sinus rhythm 11/05/22   Recent Labs: No results found for requested labs within last 365 days.     Physical Exam:    VS:  BP 132/70 (BP Location: Left Arm, Patient Position: Sitting, Cuff Size: Large)   Pulse (!) 54   Ht 6' (1.829 m)   Wt 272 lb 3.2 oz (123.5 kg)   SpO2 95%   BMI 36.92 kg/m     Wt Readings from Last 3 Encounters:  09/14/23 272 lb 3.2 oz (123.5 kg)  12/23/22 269 lb 12.8 oz (122.4 kg)  11/05/22 270 lb 6.4 oz (122.7 kg)     GEN:  Well nourished, well developed in no acute distress CARDIAC: RRR, no murmurs, rubs, gallops RESPIRATORY:  Normal work of breathing MUSCULOSKELETAL: no edema    ASSESSMENT & PLAN:    Atrial fibrillation S/p ablation by Dr. Kelsie 2022 Has had recurrence of subjectively irregular rhythm with rapid rates, likely A-fib, possibly atrial flutter With the exception of a  few episodes of palpitations in February 2024, he has not had any symptoms of A-fib recurrence Continue apixaban  5mg  BID for stroke prevention Follow-up in 1 year  Elevated coronary calcium score On atorvastatin 10 mg daily         Medication Adjustments/Labs and Tests Ordered: Current medicines are reviewed at length with the patient today.  Concerns regarding medicines are outlined above.  Orders Placed This Encounter  Procedures   EKG 12-Lead   No orders of the defined types were placed in this encounter.    Signed, Eulas FORBES Furbish, MD  09/14/2023 12:05 PM    [facility]

## 2023-09-14 NOTE — Patient Instructions (Signed)
 Medication Instructions:  Your physician recommends that you continue on your current medications as directed. Please refer to the Current Medication list given to you today. *If you need a refill on your cardiac medications before your next appointment, please call your pharmacy*   Follow-Up: At [facility], you and your health needs are our priority.  As part of our continuing mission to provide you with exceptional heart care, we have created designated Provider Care Teams.  These Care Teams include your primary Cardiologist (physician) and Advanced Practice Providers (APPs -  Physician Assistants and Nurse Practitioners) who all work together to provide you with the care you need, when you need it.  We recommend signing up for the patient portal called "MyChart".  Sign up information is provided on this After Visit Summary.  MyChart is used to connect with patients for Virtual Visits (Telemedicine).  Patients are able to view lab/test results, encounter notes, upcoming appointments, etc.  Non-urgent messages can be sent to your provider as well.   To learn more about what you can do with MyChart, go to [email or web address].    Your next appointment:   1 year(s)  Provider:   York Pellant, MD

## 2023-11-03 ENCOUNTER — Ambulatory Visit (HOSPITAL_COMMUNITY)
Admission: RE | Admit: 2023-11-03 | Discharge: 2023-11-03 | Disposition: A | Discharge status: Home / Self Care | Payer: 59 | Source: Ambulatory Visit | Attending: Family Medicine | Admitting: Family Medicine

## 2023-11-03 ENCOUNTER — Other Ambulatory Visit (HOSPITAL_COMMUNITY): Payer: Self-pay | Admitting: Family Medicine

## 2023-11-03 DIAGNOSIS — R1031 Right lower quadrant pain: Secondary | ICD-10-CM | POA: Insufficient documentation

## 2023-11-03 MED ORDER — IOHEXOL 350 MG/ML SOLN
75.0000 mL | Freq: Once | INTRAVENOUS | Status: AC | PRN
  Administered 2023-11-03: 75 mL via INTRAVENOUS

## 2023-12-15 ENCOUNTER — Encounter: Payer: Self-pay | Admitting: Cardiovascular Disease

## 2024-02-05 ENCOUNTER — Other Ambulatory Visit: Payer: Self-pay | Admitting: Student

## 2024-05-21 ENCOUNTER — Encounter: Payer: Self-pay | Admitting: Cardiovascular Disease

## 2024-05-23 ENCOUNTER — Other Ambulatory Visit: Payer: Self-pay | Admitting: *Deleted

## 2024-05-23 MED ORDER — APIXABAN 5 MG PO TABS: ORAL_TABLET | ORAL | 1 refills | Status: AC

## 2024-05-23 NOTE — Telephone Encounter (Signed)
 Prescription refill request for Eliquis  received. Indication: AF Last office visit: 09/14/23  A Mealor MD Scr: 0.97 on 08/07/21  Epic Age: 68 Weight: 123.5kg  Based on above findings Eliquis  5mg  twice daily is the appropriate dose.  Sent message to pt via My Chart he is past due for lab work (CBC,BMP).  Requested he get that done.  Refill approved

## 2024-07-11 ENCOUNTER — Ambulatory Visit
Admission: RE | Admit: 2024-07-11 | Discharge: 2024-07-11 | Disposition: A | Discharge status: Home / Self Care | Source: Ambulatory Visit | Attending: Family Medicine | Admitting: Family Medicine

## 2024-07-11 ENCOUNTER — Encounter: Payer: Self-pay | Admitting: Family Medicine

## 2024-07-11 ENCOUNTER — Other Ambulatory Visit: Payer: Self-pay | Admitting: Family Medicine

## 2024-07-11 DIAGNOSIS — R109 Unspecified abdominal pain: Secondary | ICD-10-CM

## 2024-11-16 ENCOUNTER — Ambulatory Visit: Admitting: Cardiovascular Disease
